# Patient Record
Sex: Male | Born: 1995 | Race: Black or African American | Hispanic: No | Marital: Single | Smoking: Never smoker
Health system: Southern US, Community
[De-identification: ages and names within clinical notes are randomized; demographics above are authoritative.]

## PROBLEM LIST (undated history)

## (undated) DIAGNOSIS — F909 Attention-deficit hyperactivity disorder, unspecified type: Secondary | ICD-10-CM

## (undated) HISTORY — PX: TUMOR REMOVAL: SHX12

---
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
## 2016-09-13 ENCOUNTER — Encounter (HOSPITAL_COMMUNITY): Payer: Self-pay | Admitting: Emergency Medicine

## 2016-09-13 ENCOUNTER — Emergency Department (HOSPITAL_COMMUNITY)
Admission: EM | Admit: 2016-09-13 | Discharge: 2016-09-13 | Disposition: A | Discharge status: Home / Self Care | Payer: Self-pay | Attending: Emergency Medicine | Admitting: Emergency Medicine

## 2016-09-13 DIAGNOSIS — F909 Attention-deficit hyperactivity disorder, unspecified type: Secondary | ICD-10-CM | POA: Insufficient documentation

## 2016-09-13 DIAGNOSIS — F1290 Cannabis use, unspecified, uncomplicated: Secondary | ICD-10-CM

## 2016-09-13 DIAGNOSIS — R45851 Suicidal ideations: Secondary | ICD-10-CM

## 2016-09-13 DIAGNOSIS — F329 Major depressive disorder, single episode, unspecified: Secondary | ICD-10-CM | POA: Insufficient documentation

## 2016-09-13 HISTORY — DX: Attention-deficit hyperactivity disorder, unspecified type: F90.9

## 2016-09-13 LAB — RAPID URINE DRUG SCREEN, HOSP PERFORMED
Amphetamines: NOT DETECTED
BARBITURATES: NOT DETECTED
Benzodiazepines: NOT DETECTED
Cocaine: NOT DETECTED
Opiates: NOT DETECTED
TETRAHYDROCANNABINOL: POSITIVE — AB

## 2016-09-13 LAB — COMPREHENSIVE METABOLIC PANEL
ALBUMIN: 3.9 g/dL (ref 3.5–5.0)
ALT: 9 U/L — AB (ref 17–63)
AST: 14 U/L — AB (ref 15–41)
Alkaline Phosphatase: 53 U/L (ref 38–126)
Anion gap: 7 (ref 5–15)
BUN: 8 mg/dL (ref 6–20)
CALCIUM: 9.4 mg/dL (ref 8.9–10.3)
CO2: 24 mmol/L (ref 22–32)
CREATININE: 0.87 mg/dL (ref 0.61–1.24)
Chloride: 107 mmol/L (ref 101–111)
GFR calc Af Amer: >60 mL/min (ref >60)
GLUCOSE: 86 mg/dL (ref 65–99)
Potassium: 4 mmol/L (ref 3.5–5.1)
Sodium: 138 mmol/L (ref 135–145)
TOTAL PROTEIN: 7.2 g/dL (ref 6.5–8.1)
Total Bilirubin: 1.2 mg/dL (ref 0.3–1.2)

## 2016-09-13 LAB — CBC
HEMATOCRIT: 42.3 % (ref 39.0–52.0)
HEMOGLOBIN: 13.8 g/dL (ref 13.0–17.0)
MCH: 27.7 pg (ref 26.0–34.0)
MCHC: 32.6 g/dL (ref 30.0–36.0)
MCV: 84.9 fL (ref 78.0–100.0)
Platelets: 256 10*3/uL (ref 150–400)
RBC: 4.98 MIL/uL (ref 4.22–5.81)
RDW: 12.9 % (ref 11.5–15.5)
WBC: 6.2 10*3/uL (ref 4.0–10.5)

## 2016-09-13 LAB — SALICYLATE LEVEL: Salicylate Lvl: <7 mg/dL (ref 2.8–30.0)

## 2016-09-13 LAB — ETHANOL: Alcohol, Ethyl (B): <5 mg/dL (ref <5)

## 2016-09-13 LAB — ACETAMINOPHEN LEVEL: Acetaminophen (Tylenol), Serum: <10 ug/mL — ABNORMAL LOW (ref 10–30)

## 2016-09-13 NOTE — ED Triage Notes (Signed)
Pt to ED via [facility] - brought in from a girl friend's house for suicidal ideation, pt is voluntary. He states he was venting to his friend and said he was stressed out and depressed and wanted to kill himself. He denies an actual plan, just thoughts of doing so. Patient denies HI. Pt is not very talkative in triage - tending to his cell phone, but answers questions appropriately.

## 2016-09-13 NOTE — ED Notes (Signed)
Pt belonging bags x2 @ triage desk.

## 2016-09-13 NOTE — ED Provider Notes (Signed)
[facility]-EMERGENCY DEPT Provider Note   CSN: [id number] Arrival date & time: 09/13/16  0012  By signing my name below, I, Rosario Adie, attest that this documentation has been prepared under the direction and in the presence of Lucine Bilski, Mayer Masker, MD. Electronically Signed: Rosario Adie, ED Scribe. 09/13/16. 3:45 AM.  History   Chief Complaint Chief Complaint  Patient presents with  . Suicidal   The history is provided by the patient. No language interpreter was used.    HPI Comments: Terrence Waters is a 21 y.o. male BIB [employer], with a h/o ADHD, who presents to the Emergency Department complaining of worsening suicidal ideation beginning prior to arrival. No reported plan. Per pt, he was talking to his girlfriend tonight and mentioned he wanted to commit suicide and she made him come into the ED. Per pt, he does have a h/o suicidal attempts previously attempting to OD. Pt was not hospitalized during this previous attempt. He does not currently have access to any drugs to be able to OD, and he does not have any access to firearms. No alcohol or illicit drug usage. He denies HI, auditory/visual hallucinations, fever, chest pain, shortness of breath, or any other associated symptoms.   Past Medical History:  Diagnosis Date  . ADHD    There are no active problems to display for this patient.  Past Surgical History:  Procedure Laterality Date  . 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  Social History Social History  Substance Use Topics  . Smoking status: Never Smoker  . Smokeless tobacco: Never Used  . Alcohol use No   Allergies   Patient has no known allergies.  Review of Systems Review of Systems  Constitutional: Negative for fever.  Respiratory: Negative for shortness of breath.   Cardiovascular: Negative for chest pain.   Psychiatric/Behavioral: Positive for suicidal ideas. Negative for hallucinations.  All other systems reviewed and are negative.  Physical Exam Updated Vital Signs BP 116/73 (BP Location: Left Arm)   Pulse 61   Temp 98.6 F (37 C) (Oral)   Resp 16   Ht 6\' 1"  (1.854 m)   Wt 79.4 kg (175 lb)   SpO2 100%   BMI 23.09 kg/m   Physical Exam  Constitutional: He is oriented to person, place, and time. He appears well-developed and well-nourished.  HENT:  Head: Normocephalic and atraumatic.  Cardiovascular: Normal rate, regular rhythm and normal heart sounds.   No murmur heard. Pulmonary/Chest: Effort normal and breath sounds normal. No respiratory distress. He has no wheezes.  Musculoskeletal: He exhibits no edema.  Neurological: He is alert and oriented to person, place, and time.  Skin: Skin is warm and dry.  Psychiatric: He has a normal mood and affect.  Flat affect  Nursing note and vitals reviewed.  ED Treatments / Results  DIAGNOSTIC STUDIES: Oxygen Saturation is 100% on RA, normal by my interpretation.   COORDINATION OF CARE: 3:45 AM-Discussed next steps with pt. Pt verbalized understanding and is agreeable with the plan.   Labs (all labs ordered are listed, but only abnormal results are displayed) Labs Reviewed  COMPREHENSIVE METABOLIC PANEL - Abnormal; Notable for the following:       Result Value   AST 14 (*)    ALT 9 (*)  All other components within normal limits  ACETAMINOPHEN LEVEL - Abnormal; Notable for the following:    Acetaminophen (Tylenol), Serum <10 (*)    All other components within normal limits  RAPID URINE DRUG SCREEN, HOSP PERFORMED - Abnormal; Notable for the following:    Tetrahydrocannabinol POSITIVE (*)    All other components within normal limits  ETHANOL  SALICYLATE LEVEL  CBC   EKG  EKG Interpretation None      Radiology No results found.  Procedures Procedures   Medications Ordered in ED Medications - No data to  display  Initial Impression / Assessment and Plan / ED Course  I have reviewed the triage vital signs and the nursing notes.  Pertinent labs & imaging results that were available during my care of the patient were reviewed by me and considered in my medical decision making (see chart for details).     Patient presents with suicidal ideation. Vague plan. Reports prior attempts. Nontoxic-appearing. Patient was medically screened and is medically clear for TTS evaluation.  Patient is currently cooperative and voluntary.  Final Clinical Impressions(s) / ED Diagnoses   Final diagnoses:  Suicidal ideation   New Prescriptions New Prescriptions   No medications on file   I personally performed the services described in this documentation, which was scribed in my presence. The recorded information has been reviewed and is accurate.     Shon BatonHorton, Fumiko Cham F, MD 09/13/16 [phone]

## 2016-09-13 NOTE — Consult Note (Signed)
Telepsych Consultation   Reason for Consult:  Consult for Terrence Waters, a 21 y.o male brought to the ED for suicide ideations following getting angry with his girl friend. Referring Physician:  EDP Patient Identification: Terrence Waters MRN:  [id number] Principal Diagnosis: <principal problem not specified> Diagnosis:  There are no active problems to display for this patient.   Total Time spent with patient: 30 minutes  Subjective:   Terrence Waters is a 21 y.o. male patient admitted with, "my girlfriend wanted me to come here because I was feeling angry and wanted to hurt myself.  HPI: Per the assessment document by Cheryle Horsfall, on 09/13/16, "Terrence Waters is an 21 y.o. male, African American, Single who presents to [facility] per ED report: BIB GPD, with a h/o ADHD, who presents to the Emergency Department complaining ofworsening suicidal ideation beginning prior to arrival. No reported plan. Per pt, he was talking to his girlfriend tonight and mentioned he wanted to commit suicide and she made him come into the ED. Per pt, he does have a h/o suicidal attempts previously attempting to OD. Pt was not hospitalized during this previous attempt. He does not currently have access to any drugs to be able to OD, and he does not have any access to firearms. No alcohol or illicit drug usage. He denies HI, auditory/visual hallucinations. Patient states primary concern is he got upset with g/f and made SI comments/threats. Patient states that he currently resides with aunt/uncle relatives. Patient states that he has no hx. Of psychotic symptoms. Patient states that he has had loss of sleep with 4 hours or less per night. Patient denies current SI/HI and AVH. Patient denies hx. Of S.A. Patient denies hx. Of inpatient or outpatient psych care.  Patient is dressed in scrubs and is alert and oriented x4. Patient speech was within normal limits and motor behavior appeared normal.  Patient thought process is coherent. Patient  does not appear to be responding to internal stimuli. Patient was cooperative throughout the assessment and states that he  is  not agreeable to inpatient psychiatric treatment".   On Exam: Pt was lying in bed wearing a hospital scrub. Pt is alert and oriented x4, mood was euthymic with congruent affect. Pt reiterated the reason for him coming to the hospital as detailed above. Pt said he has had time to think through about his reactions and realized that he overreacted. Pt stated that he was just upset with his girlfriend when he made the comment about wanting to kill himself. Patient currently denies any thoughts or feelings of self harm or harm to others. Pt reports he just got a new job with [employer] and he is also in school at [employer] and was a bit stressed about life in general. He said he wants to go home and begin an OP therapy to manage his stress. Patient at this time denying any SI/HI/VAH and voices no concerns.  Past Psychiatric History: See H&P  Risk to Self: Suicidal Ideation: No Suicidal Intent: No Is patient at risk for suicide?: Yes Suicidal Plan?: No Access to Means: No What has been your use of drugs/alcohol within the last 12 months?: none How many times?: 2 Other Self Harm Risks: none Triggers for Past Attempts: Unknown Intentional Self Injurious Behavior: None Risk to Others: Homicidal Ideation: No Thoughts of Harm to Others: No Current Homicidal Intent: No Current Homicidal Plan: No Access to Homicidal Means: No Identified Victim: none History of harm to others?:  No Assessment of Violence: None Noted Violent Behavior Description: n/a Does patient have access to weapons?: No Criminal Charges Pending?: No Does patient have a court date: No Prior Inpatient Therapy: Prior Inpatient Therapy: No Prior Therapy Dates: n/a Prior Therapy Facilty/Provider(s): n/a Reason for Treatment: n/a Prior Outpatient Therapy: Prior Outpatient  Therapy: No Prior Therapy Dates: n/a Prior Therapy Facilty/Provider(s): n/a Reason for Treatment: n/a Does patient have an ACCT team?: No Does patient have Intensive In-House Services?  : No Does patient have [facility] services? : No Does patient have P4CC services?: No  Past Medical History:  Past Medical History:  Diagnosis Date  . ADHD     Past Surgical History:  Procedure Laterality Date  . 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 Family Psychiatric  History: Unknown Social History:  History  Alcohol Use No     History  Drug Use  . Types: Marijuana    Social History   Social History  . Marital status: Single    Spouse name: N/A  . Number of children: N/A  . Years of education: N/A   Social History Main Topics  . Smoking status: Never Smoker  . Smokeless tobacco: Never Used  . Alcohol use No  . Drug use: Yes    Types: Marijuana  . Sexual activity: Not Asked   Other Topics Concern  . None   Social History Narrative  . None   Additional Social History:    Allergies:  No Known Allergies  Labs:  Results for orders placed or performed during the hospital encounter of 09/13/16 (from the past 48 hour(s))  Comprehensive metabolic panel     Status: Abnormal   Collection Time: 09/13/16 12:25 AM  Result Value Ref Range   Sodium 138 135 - 145 mmol/L   Potassium 4.0 3.5 - 5.1 mmol/L   Chloride 107 101 - 111 mmol/L   CO2 24 22 - 32 mmol/L   Glucose, Bld 86 65 - 99 mg/dL   BUN 8 6 - 20 mg/dL   Creatinine, Ser 0.87 0.61 - 1.24 mg/dL   Calcium 9.4 8.9 - 10.3 mg/dL   Total Protein 7.2 6.5 - 8.1 g/dL   Albumin 3.9 3.5 - 5.0 g/dL   AST 14 (L) 15 - 41 U/L   ALT 9 (L) 17 - 63 U/L   Alkaline Phosphatase 53 38 - 126 U/L   Total Bilirubin 1.2 0.3 - 1.2 mg/dL   GFR calc non Af Amer >60 >60 mL/min   GFR calc Af Amer >60 >60 mL/min    Comment: (NOTE) The eGFR has been calculated using the CKD EPI  equation. This calculation has not been validated in all clinical situations. eGFR's persistently <60 mL/min signify possible Chronic Kidney Disease.    Anion gap 7 5 - 15  Ethanol     Status: None   Collection Time: 09/13/16 12:25 AM  Result Value Ref Range   Alcohol, Ethyl (B) <5 <5 mg/dL    Comment:        LOWEST DETECTABLE LIMIT FOR SERUM ALCOHOL IS 5 mg/dL FOR MEDICAL PURPOSES ONLY   Salicylate level     Status: None   Collection Time: 09/13/16 12:25 AM  Result Value Ref Range   Salicylate Lvl <7.1 2.8 - 30.0 mg/dL  Acetaminophen level     Status: Abnormal   Collection Time: 09/13/16 12:25 AM  Result Value Ref Range  Acetaminophen (Tylenol), Serum <10 (L) 10 - 30 ug/mL    Comment:        THERAPEUTIC CONCENTRATIONS VARY SIGNIFICANTLY. A RANGE OF 10-30 ug/mL MAY BE AN EFFECTIVE CONCENTRATION FOR MANY PATIENTS. HOWEVER, SOME ARE BEST TREATED AT CONCENTRATIONS OUTSIDE THIS RANGE. ACETAMINOPHEN CONCENTRATIONS >150 ug/mL AT 4 HOURS AFTER INGESTION AND >50 ug/mL AT 12 HOURS AFTER INGESTION ARE OFTEN ASSOCIATED WITH TOXIC REACTIONS.   cbc     Status: None   Collection Time: 09/13/16 12:25 AM  Result Value Ref Range   WBC 6.2 4.0 - 10.5 K/uL   RBC 4.98 4.22 - 5.81 MIL/uL   Hemoglobin 13.8 13.0 - 17.0 g/dL   HCT 42.3 39.0 - 52.0 %   MCV 84.9 78.0 - 100.0 fL   MCH 27.7 26.0 - 34.0 pg   MCHC 32.6 30.0 - 36.0 g/dL   RDW 12.9 11.5 - 15.5 %   Platelets 256 150 - 400 K/uL  Rapid urine drug screen (hospital performed)     Status: Abnormal   Collection Time: 09/13/16 12:38 AM  Result Value Ref Range   Opiates NONE DETECTED NONE DETECTED   Cocaine NONE DETECTED NONE DETECTED   Benzodiazepines NONE DETECTED NONE DETECTED   Amphetamines NONE DETECTED NONE DETECTED   Tetrahydrocannabinol POSITIVE (A) NONE DETECTED   Barbiturates NONE DETECTED NONE DETECTED    Comment:        DRUG SCREEN FOR MEDICAL PURPOSES ONLY.  IF CONFIRMATION IS NEEDED FOR ANY PURPOSE, NOTIFY  LAB WITHIN 5 DAYS.        LOWEST DETECTABLE LIMITS FOR URINE DRUG SCREEN Drug Class       Cutoff (ng/mL) Amphetamine      1000 Barbiturate      200 Benzodiazepine   824 Tricyclics       235 Opiates          300 Cocaine          300 THC              50     No current facility-administered medications for this encounter.    No current outpatient prescriptions on file.    Musculoskeletal: UTA via camera  Psychiatric Specialty Exam: Physical Exam  Nursing note and vitals reviewed.   Review of Systems  Psychiatric/Behavioral: Negative for depression, hallucinations, memory loss, substance abuse and suicidal ideas. The patient is not nervous/anxious and does not have insomnia.   All other systems reviewed and are negative.   Blood pressure 124/62, pulse 82, temperature 98.3 F (36.8 C), temperature source Oral, resp. rate 16, height 6' 1"  (1.854 m), weight 79.4 kg (175 lb), SpO2 100 %.Body mass index is 23.09 kg/m.  General Appearance: wearing hospital gown  Eye Contact:  Good  Speech:  Clear and Coherent and Normal Rate  Volume:  Normal  Mood:  Euthymic  Affect:  Appropriate and Congruent  Thought Process:  Coherent  Orientation:  Full (Time, Place, and Person)  Thought Content:  WDL and Logical  Suicidal Thoughts:  No  Homicidal Thoughts:  No  Memory:  Immediate;   Good Recent;   Good Remote;   Good  Judgement:  Good  Insight:  Good and Present  Psychomotor Activity:  Normal  Concentration:  Concentration: Good and Attention Span: Good  Recall:  Good  Fund of Knowledge:  Good  Language:  Good  Akathisia:  No  Handed:  Right  AIMS (if indicated):     Assets:  Communication Skills Desire for Improvement  Financial Resources/Insurance  ADL's:  Intact  Cognition:  WNL  Sleep:        Treatment Plan Summary: Daily contact with patient to assess and evaluate symptoms and progress in treatment, Medication management and Plan is to discharge with OP resources to  follow up   Disposition: No evidence of imminent risk to self or others at present.   Patient does not meet criteria for psychiatric inpatient admission. Supportive therapy provided about ongoing stressors. Refer to IOP. Discussed crisis plan, support from social network, calling 911, coming to the Emergency Department, and calling Suicide Hotline.  Vicenta Aly, NP 09/13/2016 1:21 PM  Agree with NP assessment

## 2016-09-13 NOTE — Progress Notes (Signed)
Per Donell SievertSpencer, Simon NP recommend a.m. Psych evaluation Chandria Rookstool K. Sherlon HandingHarris, LCAS-A, LPC-A, [facility]  Counselor 09/13/2016 5:08 AM

## 2016-09-13 NOTE — ED Notes (Signed)
TTS counselor informed RN that pt can be discharged.

## 2016-09-13 NOTE — ED Notes (Signed)
TTS assessment underway.  

## 2016-09-13 NOTE — Progress Notes (Signed)
Per NP, Terrence Fermoina pt is psych cleared and recommended for d/c. Report given to Whitney, RN to notify the EDP the pt can be d/c.   Terrence Waters, MSW, LCSWA CSW Disposition [phone]

## 2016-09-13 NOTE — ED Provider Notes (Signed)
Received care of patient from Dr. Vallarie MareHart Horton at 8 AM. Please see her note for previous history, physical and prior care. Briefly, this is 21 year old male who presented with concern for suicidal ideation without a plan. He has been medically cleared and is currently awaiting TTS consult. He is here voluntarily.  TTS evaluated patient and feel he is stable for discharge and outpatient follow-up. Patient contracts for safety. Reports he will return if he develops worsening depression, SI. Patient discharged in stable condition with understanding of reasons to return.    [name], [name], MD 09/16/16 1036

## 2016-09-13 NOTE — BH Assessment (Signed)
Tele Assessment Note   Terrence Waters is an 21 y.o. male, African American, Single who presents to [facility] ED per ED report: BIB GPD, with a h/o ADHD, who presents to the Emergency Department complaining of worsening suicidal ideation beginning prior to arrival. No reported plan. Per pt, he was talking to his girlfriend tonight and mentioned he wanted to commit suicide and she made him come into the ED. Per pt, he does have a h/o suicidal attempts previously attempting to OD. Pt was not hospitalized during this previous attempt. He does not currently have access to any drugs to be able to OD, and he does not have any access to firearms. No alcohol or illicit drug usage. He denies HI, auditory/visual hallucinations. Patient states primary concern is he got upset with g/f and made SI comments/threats. Patient states that he currently resides with aunt/uncle relatives. Patient states that he has no hx. Of psychotic symptoms. Patient states that he has had loss of sleep with 4 hours or less per night. Patient denies current SI/HI and AVH. Patient denies hx. Of S.A. Patient denies hx. Of inpatient or outpatient psych care.  Patient is dressed in scrubs and is alert and oriented x4. Patient speech was within normal limits and motor behavior appeared normal. Patient thought process is coherent. Patient  does not appear to be responding to internal stimuli. Patient was cooperative throughout the assessment and states that he  is  not agreeable to inpatient psychiatric treatment.  Diagnosis: Major Depressive Disorder, Single Episode  Past Medical History:  Past Medical History:  Diagnosis Date  . ADHD     Past Surgical History:  Procedure Laterality Date  . TUMOR REMOVAL     tumor removal at left shin area at age 23    Family History: No family history on file.  Social History:  reports that he has never smoked. He has never used smokeless tobacco. He reports that he uses drugs, including  Marijuana. He reports that he does not drink alcohol.  Additional Social History:  Alcohol / Drug Use Pain Medications: SEE MAR Prescriptions: SEE MAR Over the Counter: SEE MAR History of alcohol / drug use?: No history of alcohol / drug abuse  CIWA: CIWA-Ar BP: 116/73 Pulse Rate: 61 COWS:    PATIENT STRENGTHS: (choose at least two) Ability for insight Capable of independent living Communication skills  Allergies: No Known Allergies  Home Medications:  (Not in a hospital admission)  OB/GYN Status:  No LMP for male patient.  General Assessment Data Location of Assessment: [facility] ED TTS Assessment: In system Is this a Tele or Face-to-Face Assessment?: Tele Assessment Is this an Initial Assessment or a Re-assessment for this encounter?: Initial Assessment Marital status: Single Maiden name: n/a Is patient pregnant?: No Pregnancy Status: No Living Arrangements: Other relatives Can pt return to current living arrangement?: Yes Admission Status: Voluntary Is patient capable of signing voluntary admission?: Yes Referral Source: Other Insurance type: [employer]     Crisis Care Plan Living Arrangements: Other relatives Name of Psychiatrist: none Name of Therapist: none  Education Status Is patient currently in school?: No Current Grade: n/a Highest grade of school patient has completed: 10th Name of school: n/a Contact person: none given  Risk to self with the past 6 months Suicidal Ideation: No Has patient been a risk to self within the past 6 months prior to admission? : No Suicidal Intent: No Has patient had any suicidal intent within the past 6 months  prior to admission? : No Is patient at risk for suicide?: Yes Suicidal Plan?: No Has patient had any suicidal plan within the past 6 months prior to admission? : No Access to Means: No What has been your use of drugs/alcohol within the last 12 months?: none Previous Attempts/Gestures: Yes How many times?:  2 Other Self Harm Risks: none Triggers for Past Attempts: Unknown Intentional Self Injurious Behavior: None Family Suicide History: No Recent stressful life event(s): Turmoil (Comment) Persecutory voices/beliefs?: No Depression: Yes Depression Symptoms: Insomnia, Fatigue, Guilt, Loss of interest in usual pleasures, Feeling worthless/self pity Substance abuse history and/or treatment for substance abuse?: No Suicide prevention information given to non-admitted patients: Yes  Risk to Others within the past 6 months Homicidal Ideation: No Does patient have any lifetime risk of violence toward others beyond the six months prior to admission? : No Thoughts of Harm to Others: No Current Homicidal Intent: No Current Homicidal Plan: No Access to Homicidal Means: No Identified Victim: none History of harm to others?: No Assessment of Violence: None Noted Violent Behavior Description: n/a Does patient have access to weapons?: No Criminal Charges Pending?: No Does patient have a court date: No Is patient on probation?: No  Psychosis Hallucinations: None noted Delusions: None noted  Mental Status Report Appearance/Hygiene: In scrubs Eye Contact: Good Motor Activity: Freedom of movement Speech: Logical/coherent Level of Consciousness: Alert Mood: Depressed Affect: Depressed Anxiety Level: Moderate Thought Processes: Relevant Judgement: Unimpaired Orientation: Person, Place, Time, Situation, Appropriate for developmental age Obsessive Compulsive Thoughts/Behaviors: Minimal  Cognitive Functioning Concentration: Normal Memory: Recent Intact, Remote Intact IQ: Average Insight: Poor Impulse Control: Poor Appetite: Fair Weight Loss: 0 Weight Gain: 0 Sleep: Decreased Total Hours of Sleep: 4 Vegetative Symptoms: None  ADLScreening [facility] Assessment Services) Patient's cognitive ability adequate to safely complete daily activities?: Yes Patient able to express need for assistance  with ADLs?: Yes Independently performs ADLs?: Yes (appropriate for developmental age)  Prior Inpatient Therapy Prior Inpatient Therapy: No Prior Therapy Dates: n/a Prior Therapy Facilty/Provider(s): n/a Reason for Treatment: n/a  Prior Outpatient Therapy Prior Outpatient Therapy: No Prior Therapy Dates: n/a Prior Therapy Facilty/Provider(s): n/a Reason for Treatment: n/a Does patient have an ACCT team?: No Does patient have Intensive In-House Services?  : No Does patient have [facility] services? : No Does patient have P4CC services?: No  ADL Screening (condition at time of admission) Patient's cognitive ability adequate to safely complete daily activities?: Yes Is the patient deaf or have difficulty hearing?: No Does the patient have difficulty seeing, even when wearing glasses/contacts?: No Does the patient have difficulty concentrating, remembering, or making decisions?: No Patient able to express need for assistance with ADLs?: Yes Does the patient have difficulty dressing or bathing?: No Independently performs ADLs?: Yes (appropriate for developmental age) Weakness of Legs: None Weakness of Arms/Hands: None       Abuse/Neglect Assessment (Assessment to be complete while patient is alone) Physical Abuse: Denies Verbal Abuse: Denies Sexual Abuse: Denies Exploitation of patient/patient's resources: Denies Self-Neglect: Denies Values / Beliefs Cultural Requests During Hospitalization: None Spiritual Requests During Hospitalization: None   Advance Directives (For Healthcare) Does Patient Have a Medical Advance Directive?: No Would patient like information on creating a medical advance directive?: No - Patient declined    Additional Information 1:1 In Past 12 Months?: No CIRT Risk: No Elopement Risk: No Does patient have medical clearance?: Yes     Disposition: Per Donell Sievert NP recommend a.m. Psych evaluation Disposition Initial Assessment Completed for this  Encounter: Yes Disposition of Patient: Other dispositions (TBD)  Elsie LincolnShean K Moua Rasmusson 09/13/2016 5:01 AM

## 2016-09-13 NOTE — ED Notes (Signed)
[facility] for AM psych re-eval.

## 2017-01-16 ENCOUNTER — Emergency Department (HOSPITAL_COMMUNITY)
Admission: EM | Admit: 2017-01-16 | Discharge: 2017-01-17 | Discharge status: Against Medical Advice | Payer: Self-pay | Attending: Emergency Medicine | Admitting: Emergency Medicine

## 2017-01-16 ENCOUNTER — Encounter (HOSPITAL_COMMUNITY): Payer: Self-pay | Admitting: Family Medicine

## 2017-01-16 DIAGNOSIS — Z5321 Procedure and treatment not carried out due to patient leaving prior to being seen by health care provider: Secondary | ICD-10-CM | POA: Insufficient documentation

## 2017-01-16 DIAGNOSIS — R1084 Generalized abdominal pain: Secondary | ICD-10-CM | POA: Insufficient documentation

## 2017-01-16 LAB — CBC
HCT: 41.4 % (ref 39.0–52.0)
HEMOGLOBIN: 13.5 g/dL (ref 13.0–17.0)
MCH: 27.7 pg (ref 26.0–34.0)
MCHC: 32.6 g/dL (ref 30.0–36.0)
MCV: 85 fL (ref 78.0–100.0)
PLATELETS: 318 10*3/uL (ref 150–400)
RBC: 4.87 MIL/uL (ref 4.22–5.81)
RDW: 13.8 % (ref 11.5–15.5)
WBC: 7.3 10*3/uL (ref 4.0–10.5)

## 2017-01-16 LAB — URINALYSIS, ROUTINE W REFLEX MICROSCOPIC
BILIRUBIN URINE: NEGATIVE
Glucose, UA: NEGATIVE mg/dL
HGB URINE DIPSTICK: NEGATIVE
Ketones, ur: NEGATIVE mg/dL
Leukocytes, UA: NEGATIVE
NITRITE: NEGATIVE
PROTEIN: NEGATIVE mg/dL
SPECIFIC GRAVITY, URINE: 1.024 (ref 1.005–1.030)
pH: 6 (ref 5.0–8.0)

## 2017-01-16 LAB — COMPREHENSIVE METABOLIC PANEL
ALBUMIN: 4.2 g/dL (ref 3.5–5.0)
ALK PHOS: 65 U/L (ref 38–126)
ALT: 11 U/L — AB (ref 17–63)
AST: 14 U/L — AB (ref 15–41)
Anion gap: 7 (ref 5–15)
BUN: 10 mg/dL (ref 6–20)
CALCIUM: 9.4 mg/dL (ref 8.9–10.3)
CO2: 29 mmol/L (ref 22–32)
CREATININE: 0.86 mg/dL (ref 0.61–1.24)
Chloride: 102 mmol/L (ref 101–111)
GFR calc Af Amer: >60 mL/min (ref >60)
GFR calc non Af Amer: >60 mL/min (ref >60)
GLUCOSE: 93 mg/dL (ref 65–99)
Potassium: 4.1 mmol/L (ref 3.5–5.1)
SODIUM: 138 mmol/L (ref 135–145)
Total Bilirubin: 0.3 mg/dL (ref 0.3–1.2)
Total Protein: 8.1 g/dL (ref 6.5–8.1)

## 2017-01-16 LAB — LIPASE, BLOOD: Lipase: 22 U/L (ref 11–51)

## 2017-01-16 NOTE — ED Triage Notes (Signed)
Patient is complaining of mid, upper abd pain with nausea and vomiting. Symptoms started around 14:00. Patient is not actively vomiting. Denies taking any medication for symptoms. Patient using cell phone while triaging.

## 2017-01-18 ENCOUNTER — Emergency Department (HOSPITAL_COMMUNITY)
Admission: EM | Admit: 2017-01-18 | Discharge: 2017-01-18 | Disposition: A | Discharge status: Home / Self Care | Payer: Self-pay | Attending: Emergency Medicine | Admitting: Emergency Medicine

## 2017-01-18 ENCOUNTER — Encounter (HOSPITAL_COMMUNITY): Payer: Self-pay | Admitting: Nurse Practitioner

## 2017-01-18 DIAGNOSIS — R197 Diarrhea, unspecified: Secondary | ICD-10-CM | POA: Insufficient documentation

## 2017-01-18 DIAGNOSIS — R112 Nausea with vomiting, unspecified: Secondary | ICD-10-CM | POA: Insufficient documentation

## 2017-01-18 DIAGNOSIS — F909 Attention-deficit hyperactivity disorder, unspecified type: Secondary | ICD-10-CM | POA: Insufficient documentation

## 2017-01-18 MED ORDER — ONDANSETRON HCL 4 MG PO TABS: 4.0000 mg | ORAL_TABLET | Freq: Three times a day (TID) | ORAL | 0 refills | Status: AC | PRN

## 2017-01-18 MED ORDER — SODIUM CHLORIDE 0.9 % IV BOLUS (SEPSIS)
1000.0000 mL | Freq: Once | INTRAVENOUS | Status: AC
  Administered 2017-01-18: 1000 mL via INTRAVENOUS

## 2017-01-18 MED ORDER — ONDANSETRON HCL 4 MG/2ML IJ SOLN
4.0000 mg | Freq: Once | INTRAMUSCULAR | Status: DC
  Filled 2017-01-18: qty 2

## 2017-01-18 MED ORDER — ONDANSETRON 8 MG PO TBDP
8.0000 mg | ORAL_TABLET | Freq: Once | ORAL | Status: AC
  Administered 2017-01-18: 8 mg via ORAL
  Filled 2017-01-18: qty 1

## 2017-01-18 NOTE — ED Provider Notes (Signed)
[facility]-EMERGENCY DEPT Provider Note   CSN: [id number] Arrival date & time: 01/18/17  1132     History   Chief Complaint Chief Complaint  Patient presents with  . Nausea  . Emesis    HPI Terrence Waters is a 21 y.o. male.  HPI   Patient is a 21 year old male with a history of ADHD presenting for 3 days of nausea, vomiting, and diarrhea. Patient presented to the ED 2 days ago but was unable to stay due to the wait.patient reports that 3 days ago he had multiple episodes of nonbilious nonbloody vomiting, as well as multiple episodes of watery diarrhea. No melena or hematochezia.This has subsequently improved, but patient was still having trouble keeping down anything by mouth today. Patient has been maintaining some hydration with ginger ale and broth. Patient reports some abdominal discomfort but no focal abdominal pain. Patient denies any fever or chills. Patient reports he has had some dizziness and lightheadedness, but no syncopal episodes. Patient has tried smoking marijuana, which relieved the nausea. Patient had a known sick contact who had a similar illness in the home he is living in just prior to his presentation.  Past Medical History:  Diagnosis Date  . ADHD     Patient Active Problem List   Diagnosis Date Noted  . Suicidal ideation     Past Surgical History:  Procedure Laterality Date  . 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  Social History Social History  Substance Use Topics  . Smoking status: Never Smoker  . Smokeless tobacco: Never Used  . Alcohol use No     Allergies   Patient has no known allergies.   Review of Systems Review of Systems  Constitutional: Negative for chills and fever.  HENT: Negative for congestion, rhinorrhea and sore throat.   Eyes: Negative for visual disturbance.    Respiratory: Negative for cough, chest tightness and shortness of breath.   Cardiovascular: Negative for chest pain, palpitations and leg swelling.  Gastrointestinal: Positive for diarrhea, nausea and vomiting. Negative for abdominal pain and blood in stool.  Genitourinary: Negative for dysuria and flank pain.  Musculoskeletal: Negative for back pain and myalgias.  Skin: Negative for rash.  Neurological: Positive for dizziness and light-headedness. Negative for syncope and headaches.     Physical Exam Updated Vital Signs BP 108/71 (BP Location: Right Arm)   Pulse 82   Temp 98.4 F (36.9 C) (Oral)   Resp 18   Ht  (1.854 m)   Wt 79.4 kg (175 lb)   SpO2 100%   BMI 23.09 kg/m   Physical Exam  Constitutional: He appears well-developed and well-nourished. No distress.  HENT:  Head: Normocephalic and atraumatic.  Mouth/Throat: Oropharynx is clear and moist.  Eyes: Pupils are equal, round, and reactive to light. Conjunctivae and EOM are normal.  Neck: Normal range of motion. Neck supple.  Cardiovascular: Normal rate, regular rhythm, S1 normal, S2 normal and intact distal pulses.   No murmur heard. Pulmonary/Chest: Effort normal and breath sounds normal. He has no wheezes. He has no rales.  Abdominal: Soft. He exhibits no distension. There is no tenderness. There is no guarding.  Musculoskeletal: Normal range of motion. He exhibits no edema or deformity.  Lymphadenopathy:    He has no  cervical adenopathy.  Neurological: He is alert.  Cranial nerves grossly intact. Patient moves extremities with good coordination and without difficulty.  Skin: Skin is warm and dry. No rash noted. No erythema.  Psychiatric: He has a normal mood and affect. His behavior is normal. Judgment and thought content normal.  Nursing note and vitals reviewed.    ED Treatments / Results  Labs (all labs ordered are listed, but only abnormal results are displayed) Labs Reviewed - No data to  display  EKG  EKG Interpretation None       Radiology No results found.  Procedures Procedures (including critical care time)  Medications Ordered in ED Medications  sodium chloride 0.9 % bolus 1,000 mL (1,000 mLs Intravenous New Bag/Given 01/18/17 2029)  ondansetron (ZOFRAN-ODT) disintegrating tablet 8 mg (8 mg Oral Given 01/18/17 2029)     Initial Impression / Assessment and Plan / ED Course  I have reviewed the triage vital signs and the nursing notes.  Pertinent labs & imaging results that were available during my care of the patient were reviewed by me and considered in my medical decision making (see chart for details).  Clinical Course as of Jan 19 2144  Wed Jan 18, 2017  1957 Went to evaluate patient and he was not present in bed. Will recheck shortly.  [AM]  2007 Patient seen and evaluated. Patient afebrile, well-appearing and in no acute distress. Benign abdominal exam. Patient amenable to IV fluid hydration and discharged with Zofran provided by mouth challenge is tolerated.  [AM]  2130 Patient feeling well and tolerating by mouth. Patient ambulated in the hall with me. Patient given return precautions for any worsening nausea or vomiting and inability to take anything by mouth, or worsening abdominal pain.  [AM]    Clinical Course User Index [AM] Elisha Ponder, PA-C    Final Clinical Impressions(s) / ED Diagnoses   Final diagnoses:  None   MDM  Patient is a 21 year old male with a history of ADHD presenting for 3 days of nausea, vomiting, and diarrhea. Patient is well-appearing, afebrile, and tolerating by mouth liquids in the department today. Suspect that patient's symptoms are due to viral gastroenteritis in the setting of a sick contact. In addition, patient is improving over the 3 days. Patient is showing no signs of acute abdominal pathology such as cholecystitis, pancreatitis, acute bowel obstruction, or appendicitis. Patient responded well to fluid  hydration, and feels prepared for discharge. Patient is in understanding and agreement with the plan of care.  This is a supervised visit with Dr. Carmell Austria. Evaluation, management, and discharge planning discussed with this attending physician.  New Prescriptions New Prescriptions   No medications on file     Delia Chimes 01/18/17 2156    Benjiman Core, MD 01/19/17 [phone]

## 2017-01-18 NOTE — Discharge Instructions (Signed)
Please see the information and instructions below regarding your visit.  Your diagnoses today include:  1. Non-intractable vomiting with nausea, unspecified vomiting type   2. Diarrhea, unspecified type    Your symptoms are likely caused by a viral gastroenteritis. Your exam was reassuring today.your lab work from 2 days ago was normal.  Tests performed today include: See side panel of your discharge paperwork for testing performed today. Vital signs are listed at the bottom of these instructions.   Medications prescribed:    Take any prescribed medications only as prescribed, and any over the counter medications only as directed on the packaging.  Home care instructions:  Please follow any educational materials contained in this packet.   Follow-up instructions: Please follow-up with your primary care provider for further evaluation of your symptoms if they are not completely improved.   Return instructions:  Please return to the Emergency Department if you experience worsening symptoms.  Please return for any worsening abdominal pain, vomiting, or diarrhea. Please return if you're unable to keep down anything by mouth. Please return if you have any other emergent concerns.  Additional Information:   Your vital signs today were: BP 108/71 (BP Location: Right Arm)    Pulse 82    Temp 98.4 F (36.9 C) (Oral)    Resp 18    Ht  (1.854 m)    Wt 79.4 kg (175 lb)    SpO2 100%    BMI 23.09 kg/m  If your blood pressure (BP) was elevated on multiple readings during this visit above 130 for the top number or above 80 for the bottom number, please have this repeated by your primary care provider within one month. --------------  Thank you for allowing [place] to participate in your care today.

## 2017-01-18 NOTE — ED Notes (Signed)
Pt able to tolerate PO fluids.  

## 2017-01-18 NOTE — ED Notes (Signed)
No response when called from lobby for VS recollect.

## 2017-01-18 NOTE — ED Triage Notes (Signed)
Patient states for the past 3-4 days he has been having abdominal pain, nausea, and vomiting. Reports coming to ER 2 days ago but left due to the wait. He did report having lab work done while waiting. States he has just been sleeping when he is not vomiting at home. Feels tired and weak and was hoping it would go away but it is still happening. Denies taking any medications for symptoms. Reports he is only able to drink water and gingerale and it stays down sometimes. Unable to tolerated food without getting sick. Patient is ambulatory and can talk without complications in complete sentences. Denies fever and chills.

## 2017-07-27 ENCOUNTER — Emergency Department (HOSPITAL_COMMUNITY)
Admission: EM | Admit: 2017-07-27 | Discharge: 2017-07-27 | Disposition: A | Discharge status: Home / Self Care | Payer: Self-pay | Attending: Emergency Medicine | Admitting: Emergency Medicine

## 2017-07-27 ENCOUNTER — Other Ambulatory Visit: Payer: Self-pay

## 2017-07-27 ENCOUNTER — Encounter (HOSPITAL_COMMUNITY): Payer: Self-pay | Admitting: Emergency Medicine

## 2017-07-27 ENCOUNTER — Ambulatory Visit (HOSPITAL_COMMUNITY)
Admission: EM | Admit: 2017-07-27 | Discharge: 2017-07-27 | Disposition: A | Discharge status: Home / Self Care | Payer: Self-pay

## 2017-07-27 DIAGNOSIS — R45851 Suicidal ideations: Secondary | ICD-10-CM

## 2017-07-27 DIAGNOSIS — F332 Major depressive disorder, recurrent severe without psychotic features: Secondary | ICD-10-CM | POA: Insufficient documentation

## 2017-07-27 LAB — COMPREHENSIVE METABOLIC PANEL
ALK PHOS: 52 U/L (ref 38–126)
ALT: 11 U/L — AB (ref 17–63)
AST: 16 U/L (ref 15–41)
Albumin: 4.2 g/dL (ref 3.5–5.0)
Anion gap: 9 (ref 5–15)
BUN: 7 mg/dL (ref 6–20)
CHLORIDE: 106 mmol/L (ref 101–111)
CO2: 24 mmol/L (ref 22–32)
CREATININE: 0.97 mg/dL (ref 0.61–1.24)
Calcium: 9.5 mg/dL (ref 8.9–10.3)
GFR calc Af Amer: >60 mL/min (ref >60)
Glucose, Bld: 91 mg/dL (ref 65–99)
Potassium: 4.1 mmol/L (ref 3.5–5.1)
Sodium: 139 mmol/L (ref 135–145)
Total Bilirubin: 1.1 mg/dL (ref 0.3–1.2)
Total Protein: 7.3 g/dL (ref 6.5–8.1)

## 2017-07-27 LAB — CBC
HCT: 44.5 % (ref 39.0–52.0)
HEMOGLOBIN: 14.7 g/dL (ref 13.0–17.0)
MCH: 28.5 pg (ref 26.0–34.0)
MCHC: 33 g/dL (ref 30.0–36.0)
MCV: 86.4 fL (ref 78.0–100.0)
PLATELETS: 249 10*3/uL (ref 150–400)
RBC: 5.15 MIL/uL (ref 4.22–5.81)
RDW: 13.1 % (ref 11.5–15.5)
WBC: 6.1 10*3/uL (ref 4.0–10.5)

## 2017-07-27 LAB — ETHANOL

## 2017-07-27 LAB — RAPID URINE DRUG SCREEN, HOSP PERFORMED
AMPHETAMINES: NOT DETECTED
Barbiturates: NOT DETECTED
Benzodiazepines: NOT DETECTED
Cocaine: NOT DETECTED
Opiates: NOT DETECTED
Tetrahydrocannabinol: POSITIVE — AB

## 2017-07-27 LAB — ACETAMINOPHEN LEVEL: Acetaminophen (Tylenol), Serum: <10 ug/mL — ABNORMAL LOW (ref 10–30)

## 2017-07-27 LAB — SALICYLATE LEVEL: Salicylate Lvl: <7 mg/dL (ref 2.8–30.0)

## 2017-07-27 NOTE — [facility] Counselor (Signed)
Per Delorise Jacksonori, RN pt is currently in the waiting room. Tori, RN reported, she will have the pt's nurse to contact TTS once pt is placed in a room.   Redmond Pullingreylese D Woodrow Drab, MS, [facility], [facility] Triage Specialist [phone]

## 2017-07-27 NOTE — ED Notes (Signed)
Staffing office notified of need for suicide sitter for patient.

## 2017-07-27 NOTE — BH Assessment (Signed)
Attempted to complete TTS consult, called several numbers, staff unable to locate pt. Will try again later

## 2017-07-27 NOTE — ED Notes (Signed)
[facility] and ready for [facility].

## 2017-07-27 NOTE — ED Triage Notes (Signed)
Patient to ED c/o suicidal ideation for the past month, reports past attempt of cutting his wrists and hx anxiety and depression, not taking medication for either. He talked with his girlfriend about his feelings and she told him to come here. Accompanied by mother at this time, patient calm and cooperative. Denies plan at this time.

## 2017-07-27 NOTE — Discharge Instructions (Signed)
Please read attached information. If you experience any new or worsening signs or symptoms please return to the emergency room for evaluation. Please follow-up with your primary care provider or specialist as discussed.  °

## 2017-07-27 NOTE — ED Notes (Signed)
Pt states that he is not SI at this time and wants to go home.

## 2017-07-27 NOTE — ED Notes (Signed)
Mother at bedside until decision made to keep pt or let him go home.

## 2017-07-27 NOTE — BH Assessment (Addendum)
Tele Assessment Note   Patient Name: Terrence Waters MRN: [id number] Referring Physician: Leotis ShamesJeffery, [place]. Location of [facility]: [facility] Location of Provider: Behavioral [facility] TTS Department  Terrence Waters is an 22 y.o. male, who [facility] Clinician asked the pt if he wanted his mother present during the assessment. Clinician expressed to the pt, sensitive information will be discussed. Pt gave consent for his mother to engage in the assessment. Clinician asked the pt, "what brought you to the hospital?" Pt reported, "my friend told me to come to get checkout, I've been depressed for a while." Pt denies, suicidal. Clinician expressed to the pt per the EDP, he is presenting with suicidal thoughts. Pt continued to deny current SI. Pt reported, a year ago he attempted suicide by cutting his wrist. Pt denies, SI, HI, AVH, self-injurious behaviors and access to weapons.   Pt denies abuse. Pt reported, smoking a half a blunt, last night. Pt's UDS is positive for marijuana. Pt denies, being linked to OPT resources (medication management and/or counseling.)  Pt reported, previous inpatient admission.  Pt presents quiet/awake in scrubs with logical/cohernet speech. Pt's mood was depressed. Pt's affect was congruent with mood. Pt's judgement is unimpaired. Pt's concentration is normal. Pt's insight and impulse control are fair. Pt reported, [facility] he could contract for safety. Pt's mother reported, she felt safe if the pt was discharged. Pt reported, if inpatient treatment was recommended he will sign-in voluntarily.   Diagnosis: F33.2 Major Depressive Disorder, recurrent, severe without psychotic features.   Past Medical History:  Past Medical History:  Diagnosis Date  . ADHD     Past Surgical History:  Procedure Laterality Date  . 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  Social History:  reports that he has never smoked. He has never used smokeless tobacco. He reports that he has current or past drug history. Drug: Marijuana. He reports that he does not drink alcohol.  Additional Social History:  Alcohol / Drug Use Pain Medications: See MAR Prescriptions: See MAR Over the Counter: See MAR History of alcohol / drug use?: Yes Substance #1 Name of Substance 1: Marijuana. 1 - Age of First Use: UTA 1 - Amount (size/oz): Pt reported, smoknig half a blunt, last night.  1 - Frequency: UTA 1 - Duration: UTA 1 - Last Use / Amount: Pt reported, last night.   CIWA: CIWA-Ar BP: 120/72 Pulse Rate: 78 COWS:    Allergies:  Allergies  Allergen Reactions  . Banana Nausea And Vomiting    Home Medications:  (Not in a hospital admission)  OB/GYN Status:  No LMP for male patient.  General Assessment Data Assessment unable to be completed: Yes Reason for not completing assessment: Unable to locate pt [facility] Assessment: In system Is this a Tele or Face-to-Face Assessment?: Tele Assessment Is this an Initial Assessment or a Re-assessment for this encounter?: Initial Assessment Marital status: Single Living Arrangements: Other (Comment)(Roommate.) Can pt return to current living arrangement?: Yes Admission Status: Voluntary Is patient capable of signing voluntary admission?: Yes Referral Source: Self/Family/Friend Insurance type: Self-pay.      Crisis Care Plan Living Arrangements: Other (Comment)(Roommate.) Legal Guardian: Other:(Self. ) Name of Psychiatrist: NA Name of Therapist: NA  Education Status Is patient currently in school?: No Is the patient employed, unemployed or receiving disability?:  Employed  Risk to self with the past 6 months Suicidal Ideation: No(Pt denies. ) Has patient been a risk to self within the past 6 months prior to admission? : No(Pt denies.) Suicidal Intent: No Has patient had any  suicidal intent within the past 6 months prior to admission? : No Is patient at risk for suicide?: No Suicidal Plan?: No(Pt denies.) Has patient had any suicidal plan within the past 6 months prior to admission? : No Access to Means: No What has been your use of drugs/alcohol within the last 12 months?: Marijuana.  Previous Attempts/Gestures: Yes How many times?: 1 Other Self Harm Risks: Cutting.  Triggers for Past Attempts: Unknown Intentional Self Injurious Behavior: Cutting Comment - Self Injurious Behavior: Pt reported, cutting his wrist a year ago. as a suicide attempt.  Family Suicide History: No Recent stressful life event(s): Other (Comment)(stressful situation and work. ) Persecutory voices/beliefs?: No Depression: Yes Depression Symptoms: Feeling angry/irritable, Feeling worthless/self pity, Loss of interest in usual pleasures, Guilt, Fatigue Substance abuse history and/or treatment for substance abuse?: No Suicide prevention information given to non-admitted patients: Not applicable  Risk to Others within the past 6 months Homicidal Ideation: No(Pt denies. ) Does patient have any lifetime risk of violence toward others beyond the six months prior to admission? : No(Pt denies.) Thoughts of Harm to Others: No Current Homicidal Intent: No Current Homicidal Plan: No Access to Homicidal Means: Yes Describe Access to Homicidal Means: NA Identified Victim: NA History of harm to others?: No(Pt denies.) Assessment of Violence: None Noted Violent Behavior Description: NA Does patient have access to weapons?: No(Pt denies.) Criminal Charges Pending?: No Does patient have a court date: No Is patient on probation?: No  Psychosis Hallucinations: None noted Delusions: None noted  Mental Status Report Appearance/Hygiene: In scrubs Eye Contact: Fair Motor Activity: Unremarkable Speech: Logical/coherent Level of Consciousness: Quiet/awake Mood: Depressed Affect: Other  (Comment)(congruent with mood. ) Anxiety Level: Minimal Thought Processes: Coherent, Relevant Judgement: Unimpaired Orientation: Person, Place, Time, Situation Obsessive Compulsive Thoughts/Behaviors: None  Cognitive Functioning Concentration: Normal Memory: Recent Intact Is patient IDD: No Is patient DD?: No Insight: Fair Impulse Control: Fair Appetite: Good Sleep: Decreased Total Hours of Sleep: (Pt reported, 4-6 hours. ) Vegetative Symptoms: None  ADLScreening [facility] Assessment Services) Patient's cognitive ability adequate to safely complete daily activities?: Yes Patient able to express need for assistance with ADLs?: Yes Independently performs ADLs?: Yes (appropriate for developmental age)  Prior Inpatient Therapy Prior Inpatient Therapy: Yes Prior Therapy Dates: 2018 Prior Therapy Facilty/Provider(s): [facility] Reason for Treatment: Pt cut his wrist as a suicide attempt.   Prior Outpatient Therapy Prior Outpatient Therapy: No Does patient have an ACCT team?: No Does patient have Intensive In-House Services?  : No Does patient have [facility] services? : No Does patient have P4CC services?: No  ADL Screening (condition at time of admission) Patient's cognitive ability adequate to safely complete daily activities?: Yes Is the patient deaf or have difficulty hearing?: No Does the patient have difficulty seeing, even when wearing glasses/contacts?: Yes(Pt reported, he is supposed to wear glasses. ) Does the patient have difficulty concentrating, remembering, or making decisions?: Yes Patient able to express need for assistance with ADLs?: Yes Does the patient have difficulty dressing or bathing?: No Independently performs ADLs?: Yes (appropriate for developmental age) Does the patient have difficulty walking or climbing stairs?: No Weakness of Legs: None Weakness of Arms/Hands: None  Home Assistive Devices/Equipment Home Assistive Devices/Equipment: None     Abuse/Neglect Assessment (  Assessment to be complete while patient is alone) Abuse/Neglect Assessment Can Be Completed: Yes Physical Abuse: Denies(Pt denies. ) Verbal Abuse: Denies(Pt denies. ) Sexual Abuse: Denies(Pt denies.) Exploitation of patient/patient's resources: Denies(Pt denies. ) Self-Neglect: Denies(Pt denies. )     Advance Directives (For Healthcare) Does Patient Have a Medical Advance Directive?: No    Additional Information 1:1 In Past 12 Months?: No CIRT Risk: No Elopement Risk: No Does patient have medical clearance?: Yes     Disposition: Nira Conn, NP recommends discharge. Discussed disposition with Dr. Fredderick Phenix and Drue Flirt, RN.   Disposition Initial Assessment Completed for this Encounter: Yes Disposition of Patient: Discharge Patient refused recommended treatment: No Mode of transportation if patient is discharged?: Car  This service was provided via telemedicine using a 2-way, interactive audio and video technology.  Names of all persons participating in this telemedicine service and their role in this encounter. Name: Shelley Pooley Role: Mother             Redmond Pulling 07/27/2017 11:49 PM   Redmond Pulling, MS, [facility], [facility] Triage Specialist [phone]

## 2017-07-27 NOTE — ED Notes (Signed)
[facility] called. Recommendation for pt to be discharged with mother. Off going MD agrees, On coming md wants to speak with pt and mother before final decision.

## 2017-07-27 NOTE — ED Notes (Signed)
TTS in progress 

## 2017-07-27 NOTE — ED Notes (Signed)
Pt states he has been having trouble with passive suicidal thoughts for a while, states "I dont have a plan right now but i've been struggling with suicidal thoughts a lot". Pt here with mother, spoke with patient about not having resources for him here, pt agreeable to go to the ER, mother states she would drive him to the ER for treatment. Directions given, pt and mother ambulated out of UC without issue.

## 2017-07-27 NOTE — ED Notes (Signed)
Pt wanded by security. 

## 2017-07-27 NOTE — ED Provider Notes (Signed)
[facility] Provider Note   CSN: [id number] Arrival date & time: 07/27/17  1336     History   Chief Complaint Chief Complaint  Patient presents with  . Suicidal    HPI Terrence Waters is a 22 y.o. male.  HPI   22 year old male presents today with complaints of suicidal ideations.  Patient notes that recently he has had thoughts of killing himself.  He does not have any particular plan, but notes that he is attempted cutting his wrists in the past.  Patient reports he smokes marijuana and occasionally uses alcohol denies any other drug use.  He denies any physical complaints here today.  Patient denies any homicidal ideations, no visual or auditory hallucinations.  Past Medical History:  Diagnosis Date  . ADHD     Patient Active Problem List   Diagnosis Date Noted  . Suicidal ideation     Past Surgical History:  Procedure Laterality Date  . 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  Social History Social History   Tobacco Use  . Smoking status: Never Smoker  . Smokeless tobacco: Never Used  Substance Use Topics  . Alcohol use: No  . Drug use: Yes    Types: Marijuana    Comment: Last used: Two days ago     Allergies   Banana   Review of Systems Review of Systems  All other systems reviewed and are negative.    Physical Exam Updated Vital Signs BP 101/63 (BP Location: Right Arm)   Pulse 84   Temp 98.4 F (36.9 C) (Oral)   Resp 16   SpO2 97%   Physical Exam  Constitutional: He is oriented to person, place, and time. He appears well-developed and well-nourished.  HENT:  Head: Normocephalic and atraumatic.  Eyes: Pupils are equal, round, and reactive to light. Conjunctivae are normal. Right eye exhibits no discharge. Left eye exhibits no discharge. No scleral  icterus.  Neck: Normal range of motion. No JVD present. No tracheal deviation present.  Cardiovascular: Normal rate, regular rhythm, normal heart sounds and intact distal pulses.  No murmur heard. Pulmonary/Chest: Effort normal and breath sounds normal. No stridor. No respiratory distress. He has no wheezes. He has no rales. He exhibits no tenderness.  Neurological: He is alert and oriented to person, place, and time. Coordination normal.  Psychiatric: He has a normal mood and affect. His behavior is normal. Judgment and thought content normal.  Nursing note and vitals reviewed.    ED Treatments / Results  Labs (all labs ordered are listed, but only abnormal results are displayed) Labs Reviewed  COMPREHENSIVE METABOLIC PANEL - Abnormal; Notable for the following components:      Result Value   ALT 11 (*)    All other components within normal limits  ACETAMINOPHEN LEVEL - Abnormal; Notable for the following components:   Acetaminophen (Tylenol), Serum <10 (*)    All other components within normal limits  RAPID URINE DRUG SCREEN, HOSP PERFORMED - Abnormal; Notable for the following components:   Tetrahydrocannabinol POSITIVE (*)    All other components within normal limits  ETHANOL  SALICYLATE LEVEL  CBC    EKG None  Radiology No results found.  Procedures Procedures (including critical care time)  Medications  Ordered in ED Medications - No data to display   Initial Impression / Assessment and Plan / ED Course  I have reviewed the triage vital signs and the nursing notes.  Pertinent labs & imaging results that were available during my care of the patient were reviewed by me and considered in my medical decision making (see chart for details).      Final Clinical Impressions(s) / ED Diagnoses   Final diagnoses:  Suicidal ideation    Labs: Rapid urine drug screen, CMP, ethanol, salicylate, acetaminophen, CBC  Imaging:  Consults:  Therapeutics:  Discharge  Meds:   Assessment/Plan: Patient here with suicidal ideations, patient is medically cleared awaiting TTS evaluation.  TTS will be evaluating the patient shortly.  I feel the patient is stable and would be comfortable with any decision behavioral health would like including disposition with outpatient resources or ongoing inpatient management.  I discussed with mother both options she agrees that both would be reasonable.  I have plan for patient's paperwork in the event behavioral health indicates patient can go home mother knows all follow-up information and return precautions.  Paperwork left quad asked staff with instructions at discharge based on behavioral health decision.       ED Discharge Orders    None       Rosalio Loud 07/27/17 2205    Pricilla Loveless, MD 07/27/17 [phone]

## 2017-07-27 NOTE — ED Notes (Signed)
Patient denies pain and is resting comfortably.  

## 2018-07-09 ENCOUNTER — Emergency Department (HOSPITAL_COMMUNITY)
Admission: EM | Admit: 2018-07-09 | Discharge: 2018-07-09 | Disposition: A | Discharge status: Home / Self Care | Payer: Self-pay | Attending: Emergency Medicine | Admitting: Emergency Medicine

## 2018-07-09 ENCOUNTER — Other Ambulatory Visit: Payer: Self-pay

## 2018-07-09 DIAGNOSIS — F909 Attention-deficit hyperactivity disorder, unspecified type: Secondary | ICD-10-CM | POA: Insufficient documentation

## 2018-07-09 DIAGNOSIS — J029 Acute pharyngitis, unspecified: Secondary | ICD-10-CM | POA: Insufficient documentation

## 2018-07-09 LAB — GROUP A STREP BY PCR: GROUP A STREP BY PCR: NOT DETECTED

## 2018-07-09 NOTE — Discharge Instructions (Signed)
You were seen in the ER for sore throat and nasal congestion, runny nose.  Your strep test was negative.  I suspect her symptoms are from a virus.  Alternate ibuprofen and acetaminophen for throat pain and swelling.  Warm liquids such as tea with honey will help with the inflammation.  Stay well hydrated.  Return to the ER if there is any worsening pain in the throat, difficulty swallowing, changes to her voice, difficulty opening her jaw.

## 2018-07-09 NOTE — ED Provider Notes (Addendum)
[facility] EMERGENCY DEPARTMENT Provider Note   CSN: [id number] Arrival date & time: 07/09/18  2001    History   Chief Complaint Chief Complaint  Patient presents with  . Sore Throat   HPI Terrence Waters is a 23 y.o. male is here for evaluation of sore throat.  Associated with runny nose.  Onset this morning while at work.  His boss told him he needed to come to the ER.  No interventions.  No alleviating factors.  Pain in his throat is mild.  Denies any sick contacts.  Denies any associated fevers, chills, cough, headaches, changes in voice, trismus, chest pain or shortness of breath, myalgias, vomiting, diarrhea, abdominal pain.   HPI  Past Medical History:  Diagnosis Date  . ADHD     Patient Active Problem List   Diagnosis Date Noted  . Suicidal ideation     Past Surgical History:  Procedure Laterality Date  . 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  Social History Social History   Tobacco Use  . Smoking status: Never Smoker  . Smokeless tobacco: Never Used  Substance Use Topics  . Alcohol use: No  . Drug use: Yes    Types: Marijuana    Comment: Last used: Two days ago     Allergies   Banana   Review of Systems Review of Systems  HENT: Positive for rhinorrhea and sore throat.   All other systems reviewed and are negative.    Physical Exam Updated Vital Signs BP 123/75 (BP Location: Right Arm)   Pulse 73   Resp 16   SpO2 99%   Physical Exam Vitals signs and nursing note reviewed.  Constitutional:      General: He is not in acute distress.    Appearance: He is well-developed.     Comments: NAD.  HENT:     Head: Normocephalic and atraumatic.     Right Ear: External ear normal.     Left Ear: External ear normal.     Nose: Congestion present.     Comments: Moderate  mucosal edema, erythema.  No rhinorrhea.    Mouth/Throat:     Mouth: Mucous membranes are moist.     Comments: Mild erythema diffusely to oropharynx, tonsils.  Mildly, symmetrically enlarged tonsils bilaterally.  Uvula is midline.  No exudates.  No petechiae.  No trismus.  No drooling.  Normal phonation.  No sublingual fullness. Eyes:     General: No scleral icterus.    Conjunctiva/sclera: Conjunctivae normal.  Neck:     Musculoskeletal: Normal range of motion and neck supple.     Comments: No anterior neck asymmetric edema.  No cervical lymphadenopathy. Cardiovascular:     Rate and Rhythm: Normal rate and regular rhythm.     Heart sounds: Normal heart sounds. No murmur.  Pulmonary:     Effort: Pulmonary effort is normal.     Breath sounds: Normal breath sounds. No wheezing.  Musculoskeletal: Normal range of motion.        General: No deformity.  Skin:    General: Skin is warm and dry.     Capillary Refill: Capillary refill takes less than 2 seconds.  Neurological:     Mental Status: He is alert and oriented to person,  place, and time.  Psychiatric:        Behavior: Behavior normal.        Thought Content: Thought content normal.        Judgment: Judgment normal.      ED Treatments / Results  Labs (all labs ordered are listed, but only abnormal results are displayed) Labs Reviewed  GROUP A STREP BY PCR    EKG None  Radiology No results found.  Procedures Procedures (including critical care time)  Medications Ordered in ED Medications - No data to display   Initial Impression / Assessment and Plan / ED Course  I have reviewed the triage vital signs and the nursing notes.  Pertinent labs & imaging results that were available during my care of the patient were reviewed by me and considered in my medical decision making (see chart for details).       23 year old here with pharyngitis.  Rapid strep is negative.  Likely viral related versus postnasal drip  inflammation of the throat.  No concerning physical exam findings such as asymmetric tonsillar hypertrophy, uvula deviation, muffled voice, trismus, drooling.  History and exam is not concerning for deep neck space infection like RPA, PTA, Ludwig's.  Doubt epiglottitis.  Centor score is low.  No indication for antibiotics.  Will discharge with symptomatic management.. Will discharge with symptomatic management. I have discussed signs and symptoms that would warrant immediate return to ER and patient verbalized understanding. She is tolerating secretions and PO in ER.    Final Clinical Impressions(s) / ED Diagnoses   Final diagnoses:  Viral pharyngitis    ED Discharge Orders    None       Jerrell Mylar 07/09/18 2243    Linwood Dibbles, MD 07/10/18 1505

## 2018-07-09 NOTE — ED Triage Notes (Signed)
Pt reports sore throat, runny nose X1 day. States his job sent him home.

## 2019-01-25 ENCOUNTER — Other Ambulatory Visit: Payer: Self-pay

## 2019-01-25 DIAGNOSIS — Z20822 Contact with and (suspected) exposure to covid-19: Secondary | ICD-10-CM

## 2019-01-26 LAB — NOVEL CORONAVIRUS, NAA: SARS-CoV-2, NAA: NOT DETECTED

## 2019-11-14 ENCOUNTER — Ambulatory Visit (HOSPITAL_COMMUNITY)
Admission: EM | Admit: 2019-11-14 | Discharge: 2019-11-14 | Disposition: A | Discharge status: Home / Self Care | Payer: Self-pay | Attending: Internal Medicine | Admitting: Internal Medicine

## 2019-11-14 ENCOUNTER — Encounter (HOSPITAL_COMMUNITY): Payer: Self-pay | Admitting: Emergency Medicine

## 2019-11-14 ENCOUNTER — Other Ambulatory Visit: Payer: Self-pay

## 2019-11-14 DIAGNOSIS — R05 Cough: Secondary | ICD-10-CM | POA: Insufficient documentation

## 2019-11-14 DIAGNOSIS — J029 Acute pharyngitis, unspecified: Secondary | ICD-10-CM

## 2019-11-14 DIAGNOSIS — R070 Pain in throat: Secondary | ICD-10-CM | POA: Insufficient documentation

## 2019-11-14 DIAGNOSIS — B349 Viral infection, unspecified: Secondary | ICD-10-CM

## 2019-11-14 DIAGNOSIS — R52 Pain, unspecified: Secondary | ICD-10-CM

## 2019-11-14 DIAGNOSIS — R509 Fever, unspecified: Secondary | ICD-10-CM

## 2019-11-14 DIAGNOSIS — R059 Cough, unspecified: Secondary | ICD-10-CM

## 2019-11-14 DIAGNOSIS — Z20822 Contact with and (suspected) exposure to covid-19: Secondary | ICD-10-CM | POA: Insufficient documentation

## 2019-11-14 DIAGNOSIS — R0981 Nasal congestion: Secondary | ICD-10-CM | POA: Insufficient documentation

## 2019-11-14 DIAGNOSIS — F909 Attention-deficit hyperactivity disorder, unspecified type: Secondary | ICD-10-CM | POA: Insufficient documentation

## 2019-11-14 LAB — POCT RAPID STREP A: Streptococcus, Group A Screen (Direct): NEGATIVE

## 2019-11-14 MED ORDER — ACETAMINOPHEN 325 MG PO TABS
650.0000 mg | ORAL_TABLET | Freq: Once | ORAL | Status: AC
  Administered 2019-11-14: 20:00:00 650 mg via ORAL

## 2019-11-14 MED ORDER — CETIRIZINE HCL 10 MG PO TABS: 10.0000 mg | ORAL_TABLET | Freq: Every day | ORAL | 0 refills | Status: DC

## 2019-11-14 MED ORDER — IBUPROFEN 600 MG PO TABS: 600.0000 mg | ORAL_TABLET | Freq: Three times a day (TID) | ORAL | 0 refills | Status: DC | PRN

## 2019-11-14 MED ORDER — BENZONATATE 100 MG PO CAPS
100.0000 mg | ORAL_CAPSULE | Freq: Three times a day (TID) | ORAL | 0 refills | Status: DC | PRN
Start: 2019-11-14 — End: 2020-11-22

## 2019-11-14 MED ORDER — PSEUDOEPHEDRINE HCL 60 MG PO TABS: 60.0000 mg | ORAL_TABLET | Freq: Three times a day (TID) | ORAL | 0 refills | Status: DC | PRN

## 2019-11-14 MED ORDER — PROMETHAZINE-DM 6.25-15 MG/5ML PO SYRP: 5.0000 mL | ORAL_SOLUTION | Freq: Every evening | ORAL | 0 refills | Status: DC | PRN

## 2019-11-14 MED ORDER — ACETAMINOPHEN 325 MG PO TABS
ORAL_TABLET | ORAL | Status: AC
  Filled 2019-11-14: qty 2

## 2019-11-14 NOTE — Discharge Instructions (Addendum)

## 2019-11-14 NOTE — ED Triage Notes (Signed)
Pt c/o sore throat onset last night. He states today at work he began to experience bodyaches and felt hot so he left early. His fever was 103 F. He states he has not had any over the counter medications.

## 2019-11-14 NOTE — ED Provider Notes (Signed)
[facility]   MRN: [id number] DOB: 06-Dec-1995  Subjective:   Terrence Waters is a 24 y.o. male presenting for 2-day history of acute onset throat pain.  Symptoms progressed to body aches, runny and stuffy nose, cough, fatigue and malaise.  Patient has not had Covid vaccination.  Has not taken medications for relief.  No current facility-administered medications for this encounter. No current outpatient medications on file.   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  Objective:   Vitals: Pulse 91    Temp (!) 101.9 F (38.8 C) (Oral)    Resp 19    SpO2 100%   Physical Exam Constitutional:      General: He is not in acute distress.    Appearance: Normal appearance. He is well-developed and normal weight. He is not ill-appearing, toxic-appearing or diaphoretic.  HENT:     Head: Normocephalic and atraumatic.     Right Ear: Tympanic membrane, ear canal and external ear normal. There is no impacted cerumen.     Left Ear: Tympanic membrane, ear canal and external ear normal. There is no impacted cerumen.     Nose: Nose normal. No congestion or rhinorrhea.     Mouth/Throat:     Mouth: Mucous membranes are moist.     Pharynx: Oropharynx is clear. Posterior oropharyngeal erythema present. No pharyngeal swelling, oropharyngeal exudate or uvula swelling.  Eyes:     General: No scleral icterus.       Right eye: No discharge.         Left eye: No discharge.     Extraocular Movements: Extraocular movements intact.     Conjunctiva/sclera: Conjunctivae normal.     Pupils: Pupils are equal, round, and reactive to light.  Cardiovascular:     Rate and Rhythm: Normal rate and regular rhythm.     Heart sounds: Normal heart sounds. No murmur heard.  No friction rub. No gallop.   Pulmonary:     Effort: Pulmonary effort is normal. No respiratory distress.     Breath sounds: Normal breath sounds. No stridor. No wheezing, rhonchi or rales.  Musculoskeletal:     Cervical back: Normal range of motion and neck supple. No rigidity. No muscular tenderness.  Neurological:     General: No focal deficit present.     Mental Status: He is alert and oriented to person, place, and time.  Psychiatric:        Mood and Affect: Mood normal.        Behavior: Behavior normal.        Thought Content: Thought content normal.     Results for orders placed or performed during the hospital encounter of 11/14/19 (from the past 24 hour(s))  POCT rapid strep A [facility])  Status: None   Collection Time: 11/14/19  8:07 PM  Result Value Ref Range   Streptococcus, Group A Screen (Direct) NEGATIVE NEGATIVE    Assessment and Plan :   PDMP not reviewed this encounter.  1. Viral illness   2. Body aches   3. Cough   4. Throat pain   5. Nasal congestion     Will manage for viral illness such as viral URI, viral syndrome, viral rhinitis, COVID-19. Counseled patient on nature of COVID-19 including modes of transmission, diagnostic testing, management and supportive care.  Offered scripts for symptomatic relief. COVID 19 testing is pending. Counseled patient on potential for adverse effects with medications prescribed/recommended today, ER and return-to-clinic precautions discussed, patient verbalized understanding.     Wallis Bamberg, PA-C 11/14/19 2014

## 2019-11-15 LAB — SARS CORONAVIRUS 2 (TAT 6-24 HRS): SARS Coronavirus 2: NEGATIVE

## 2019-11-17 LAB — CULTURE, GROUP A STREP (THRC)

## 2019-11-18 ENCOUNTER — Other Ambulatory Visit: Payer: Self-pay

## 2019-11-18 ENCOUNTER — Ambulatory Visit (HOSPITAL_COMMUNITY)
Admission: EM | Admit: 2019-11-18 | Discharge: 2019-11-18 | Disposition: A | Discharge status: Home / Self Care | Payer: Self-pay

## 2019-11-18 ENCOUNTER — Encounter (HOSPITAL_COMMUNITY): Payer: Self-pay

## 2019-11-18 DIAGNOSIS — B084 Enteroviral vesicular stomatitis with exanthem: Secondary | ICD-10-CM

## 2019-11-18 DIAGNOSIS — S39011A Strain of muscle, fascia and tendon of abdomen, initial encounter: Secondary | ICD-10-CM

## 2019-11-18 NOTE — ED Triage Notes (Signed)
Pt is here with a rash that is on his face and hands, also is having abdominal pain started Saturday, pt has taken Tylenol to relieve discomfort.

## 2019-11-18 NOTE — ED Provider Notes (Addendum)
[facility]    CSN: [id number] Arrival date & time: 11/18/19  1549      History   Chief Complaint Chief Complaint  Patient presents with  . Rash  . Abdominal Pain    HPI Terrence Waters is a 24 y.o. male.   Pt complains non painful rash around his mouth and on his hands that started several days ago.  He was seen last Thursday for scratchy throat, subjective fever which resolved one day later then he noticed the rash.  His 1 yo son recently diagnosed with HFM, sx have now resolved.  Pt has tried nothing for the sx.  He also complains of left lower abdominal pain that started about 1 week ago and is worse with movement.  Denies n/v/d, dysuria, radiation of pain.  Pt works in a warehouse which requires heavy lifting.  He reports pain is worse with lifting.  He has taken tylenol with improvement.      Past Medical History:  Diagnosis Date  . ADHD     Patient Active Problem List   Diagnosis Date Noted  . Suicidal ideation     Past Surgical History:  Procedure Laterality Date  . 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 Authorizing Provider  FLUoxetine (PROZAC) 20 MG capsule Take by mouth. 10/10/16  Yes [provider]  benzonatate (TESSALON) 100 MG capsule Take 1-2 capsules (100-200 mg total) by mouth 3 (three) times daily as needed. 11/14/19   Wallis Bamberg, PA-C  cetirizine (ZYRTEC ALLERGY) 10 MG tablet Take 1 tablet (10 mg total) by mouth daily. 11/14/19   Wallis Bamberg, PA-C  ibuprofen (ADVIL) 600 MG tablet Take 1 tablet (600 mg total) by mouth every 8 (eight) hours as needed. 11/14/19   Wallis Bamberg, PA-C  promethazine-dextromethorphan (PROMETHAZINE-DM) 6.25-15 MG/5ML syrup Take 5 mLs by mouth at bedtime as needed for cough. 11/14/19   Wallis Bamberg, PA-C  pseudoephedrine (SUDAFED) 60 MG tablet Take 1 tablet (60 mg total) by mouth  every 8 (eight) hours as needed for congestion. 11/14/19   Wallis Bamberg, PA-C    Family History Family History  Problem Relation Age of Onset  . Healthy Mother   . Healthy Father     Social History Social History   Tobacco Use  . Smoking status: Never Smoker  . Smokeless tobacco: Never Used  Vaping Use  . Vaping Use: Never used  Substance Use Topics  . Alcohol use: No  . Drug use: Yes    Types: Marijuana     Allergies   Banana   Review of Systems Review of Systems  Constitutional: Negative for chills and fever.  HENT: Negative for ear pain and sore throat.   Eyes: Negative for pain and visual disturbance.  Respiratory: Negative for cough and shortness of breath.   Cardiovascular: Negative for chest pain and palpitations.  Gastrointestinal: Positive for abdominal pain (left lower abdominal pain). Negative for blood in stool, constipation, diarrhea, nausea and vomiting.  Genitourinary: Negative for dysuria and hematuria.  Musculoskeletal: Negative for arthralgias and back pain.  Skin: Positive for rash (around mouth, bilateral hands). Negative for color change.  Neurological: Negative for seizures and syncope.  All other systems reviewed and are negative.    Physical Exam Triage Vital Signs ED Triage Vitals  Enc Vitals Group  BP 11/18/19 1712 118/79     Pulse Rate 11/18/19 1712 71     Resp 11/18/19 1712 17     Temp 11/18/19 1712 98.8 F (37.1 C)     Temp Source 11/18/19 1712 Oral     SpO2 11/18/19 1712 100 %     Weight --      Height --      Head Circumference --      Peak Flow --      Pain Score 11/18/19 1709 2     Pain Loc --      Pain Edu? --      Excl. in GC? --    No data found.  Updated Vital Signs BP 118/79 (BP Location: Right Arm)   Pulse 71   Temp 98.8 F (37.1 C) (Oral)   Resp 17   SpO2 100%   Visual Acuity Right Eye Distance:   Left Eye Distance:   Bilateral Distance:    Right Eye Near:   Left Eye Near:    Bilateral Near:       Physical Exam Vitals and nursing note reviewed.  Constitutional:      Appearance: He is well-developed.  HENT:     Head: Normocephalic and atraumatic.     Comments: Vesicular rash around the mouth and base of nose without crusting, discharge.  Eyes:     Conjunctiva/sclera: Conjunctivae normal.  Cardiovascular:     Rate and Rhythm: Normal rate and regular rhythm.     Heart sounds: No murmur heard.   Pulmonary:     Effort: Pulmonary effort is normal. No respiratory distress.     Breath sounds: Normal breath sounds.  Abdominal:     Palpations: Abdomen is soft.     Tenderness: There is no abdominal tenderness.  Musculoskeletal:     Cervical back: Neck supple.  Skin:    General: Skin is warm and dry.     Comments: Vesicular exanthem to bilateral palms and interdigit spaces consistent with HFM  Neurological:     Mental Status: He is alert.      UC Treatments / Results  Labs (all labs ordered are listed, but only abnormal results are displayed) Labs Reviewed - No data to display  EKG   Radiology No results found.  Procedures Procedures (including critical care time)  Medications Ordered in UC Medications - No data to display  Initial Impression / Assessment and Plan / UC Course  I have reviewed the triage vital signs and the nursing notes.  Pertinent labs & imaging results that were available during my care of the patient were reviewed by me and considered in my medical decision making (see chart for details).     Rash to hands and around mouth consistent with HFM.  1yo son recently diagnosed with HFM as well.  Viral rash which will run it's course.   Left lower abdominal pain that is worse with movement and lifting most likely abdominal muscle wall strain.  No hernia noted.  Advised Ibuprofen as needed and rest.  Will provide a work note for pt.  Return precautions discussed.  Final Clinical Impressions(s) / UC Diagnoses   Final diagnoses:  None   Discharge  Instructions   None    ED Prescriptions    None     PDMP not reviewed this encounter.   Jodell Cipro, PA-C 11/18/19 1743    Jodell Cipro, PA-C 11/18/19 1752

## 2019-11-18 NOTE — Discharge Instructions (Signed)
Recommend Ibuprofen as needed for abdominal wall pain.  Viral rash, hand foot and mouth, will run it's course similar to your son's rash.

## 2019-11-20 ENCOUNTER — Encounter (HOSPITAL_COMMUNITY): Payer: Self-pay

## 2019-11-20 ENCOUNTER — Emergency Department (HOSPITAL_COMMUNITY)
Admission: EM | Admit: 2019-11-20 | Discharge: 2019-11-20 | Disposition: A | Discharge status: Home / Self Care | Payer: Self-pay | Attending: Emergency Medicine | Admitting: Emergency Medicine

## 2019-11-20 ENCOUNTER — Emergency Department (HOSPITAL_COMMUNITY): Payer: Self-pay

## 2019-11-20 DIAGNOSIS — R1032 Left lower quadrant pain: Secondary | ICD-10-CM | POA: Insufficient documentation

## 2019-11-20 DIAGNOSIS — R109 Unspecified abdominal pain: Secondary | ICD-10-CM

## 2019-11-20 DIAGNOSIS — N50812 Left testicular pain: Secondary | ICD-10-CM | POA: Insufficient documentation

## 2019-11-20 LAB — URINALYSIS, ROUTINE W REFLEX MICROSCOPIC
Bilirubin Urine: NEGATIVE
Glucose, UA: NEGATIVE mg/dL
Hgb urine dipstick: NEGATIVE
Ketones, ur: NEGATIVE mg/dL
Leukocytes,Ua: NEGATIVE
Nitrite: NEGATIVE
Protein, ur: NEGATIVE mg/dL
Specific Gravity, Urine: 1.019 (ref 1.005–1.030)
pH: 6 (ref 5.0–8.0)

## 2019-11-20 MED ORDER — METHOCARBAMOL 500 MG PO TABS
500.0000 mg | ORAL_TABLET | Freq: Three times a day (TID) | ORAL | 0 refills | Status: DC | PRN
Start: 2019-11-20 — End: 2020-11-22

## 2019-11-20 NOTE — Discharge Instructions (Signed)
Follow-up with your doctor as needed.  Return for worsening abdominal pain or fevers.

## 2019-11-20 NOTE — ED Triage Notes (Addendum)
Patient arrived via [facility] from home.   Called out for left lower quadrant pain X1 week. Patient reports a lot of lifting at his job.   Pain increases with movement and walking.  Tender to palpation.   Denies N/V or diarrhea  Denies urinary symptoms.   Patient seen at Urgent Care for foot and mouth and abdominal pain.   Patient was told pain was from musculoskeletal.

## 2019-11-20 NOTE — ED Provider Notes (Signed)
[facility]-EMERGENCY DEPT Provider Note   CSN: [id number] Arrival date & time: 11/20/19  0731     History Chief Complaint  Patient presents with   Abdominal Pain    Terrence Waters is a 24 y.o. male.  HPI Patient presents with abdominal pain.  Has had for around a week now.  Left lower abdomen.  Has been seen in urgent care because he also had a rash.  Family members that had hand-foot-and-mouth disease.  Diagnosed with that and muscular strain from his job that involves lifting.  Continued pain.  States the pain is worse after standing and walking.  Improves with sitting.  No fevers or chills.  No dysuria.  No nausea vomiting or diarrhea.  No change in bowel habits.  Does have mild pain in the left testicle.    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 Authorizing Provider  benzonatate (TESSALON) 100 MG capsule Take 1-2 capsules (100-200 mg total) by mouth 3 (three) times daily as needed. 11/14/19   Wallis Bamberg, PA-C  cetirizine (ZYRTEC ALLERGY) 10 MG tablet Take 1 tablet (10 mg total) by mouth daily. 11/14/19   Wallis Bamberg, PA-C  FLUoxetine (PROZAC) 20 MG capsule Take by mouth. 10/10/16   [provider]  ibuprofen (ADVIL) 600 MG tablet Take 1 tablet (600 mg total) by mouth every 8 (eight) hours as needed. 11/14/19   Wallis Bamberg, PA-C    methocarbamol (ROBAXIN) 500 MG tablet Take 1 tablet (500 mg total) by mouth every 8 (eight) hours as needed for muscle spasms. 11/20/19   Benjiman Core, MD  promethazine-dextromethorphan (PROMETHAZINE-DM) 6.25-15 MG/5ML syrup Take 5 mLs by mouth at bedtime as needed for cough. 11/14/19   Wallis Bamberg, PA-C  pseudoephedrine (SUDAFED) 60 MG tablet Take 1 tablet (60 mg total) by mouth every 8 (eight) hours as needed for congestion. 11/14/19   Wallis Bamberg, PA-C    Allergies    Banana  Review of Systems   Review of Systems  Constitutional: Negative for appetite change.  HENT: Negative for congestion.   Respiratory: Negative for shortness of breath.   Gastrointestinal: Positive for abdominal pain.  Genitourinary: Positive for testicular pain.  Musculoskeletal: Negative for back pain.  Skin: Negative for rash.  Neurological: Negative for weakness.  Psychiatric/Behavioral: Negative for confusion.    Physical Exam Updated Vital Signs BP (!) 130/69    Pulse 70    Temp 97.8 F (36.6 C)    Resp 17    Ht 6' (1.829 m)    Wt 77.1 kg    SpO2 100%    BMI 23.06 kg/m   Physical Exam Vitals and nursing note reviewed.  HENT:  Head: Normocephalic.  Cardiovascular:     Rate and Rhythm: Normal rate.  Pulmonary:     Effort: Pulmonary effort is normal.  Abdominal:     Tenderness: There is abdominal tenderness.     Comments: Left lower abdominal wall tenderness.  No hernia palpated.  Tenderness over musculature but when palpated deep from a more medial approach did not involve the musculature there is not the same amount of tenderness.  Genitourinary:    Comments: Some tenderness to left testicle.  No hernia palpated.  Normal lie.  No penile discharge. Skin:    General: Skin is warm.     Capillary Refill: Capillary refill takes less than 2 seconds.  Neurological:     Mental Status: He is alert and oriented to person, place, and time.     ED Results / Procedures / Treatments   Labs (all  labs ordered are listed, but only abnormal results are displayed) Labs Reviewed  URINALYSIS, ROUTINE W REFLEX MICROSCOPIC    EKG None  Radiology US SCROTUM W/DOPPLER  Result Date: 11/20/2019 CLINICAL DATA:  Left lower quadrant pain.  Testicle pain. EXAM: SCROTAL ULTRASOUND DOPPLER ULTRASOUND OF THE TESTICLES TECHNIQUE: Complete ultrasound examination of the testicles, epididymis, and other scrotal structures was performed. Color and spectral Doppler ultrasound were also utilized to evaluate blood flow to the testicles. COMPARISON:  None. FINDINGS: Right testicle Measurements: 4.4 x 2.1 x 3.3 cm. No mass or microlithiasis visualized. Left testicle Measurements: 4.6 x 2.0 x 2.9 cm. No mass or microlithiasis visualized. Right epididymis:  Normal in size and appearance. Left epididymis:  Normal in size and appearance. Hydrocele:  None visualized. Varicocele:  None visualized. Pulsed Doppler interrogation of both testes demonstrates normal low resistance arterial and venous waveforms bilaterally. IMPRESSION: 1. Normal exam. 2. No signs of testicular torsion or mass. Electronically Signed   By: Signa Kell M.D.   On: 11/20/2019 10:25    Procedures Procedures (including critical care time)  Medications Ordered in ED Medications - No data to display  ED Course  I have reviewed the triage vital signs and the nursing notes.  Pertinent labs & imaging results that were available during my care of the patient were reviewed by me and considered in my medical decision making (see chart for details).    MDM Rules/Calculators/A&P                          Patient with abdominal pain.  However does have some testicular tenderness.  Had been recently seen and thought that it was abdominal wall pain.  Urine reassuring.  Ultrasound done also shows no testicular issues.  Tender Ness on abdominal wall was only palpation of the musculature.  If I approach for more of a medial aspect and avoid the musculature  for that area there is no tenderness.  Patient also had hand-foot-and-mouth disease that I think is probably unrelated.  Will discharge home. Final Clinical Impression(s) / ED Diagnoses Final diagnoses:  Abdominal wall pain    Rx / DC Orders ED Discharge Orders         Ordered    methocarbamol (ROBAXIN) 500 MG tablet  Every 8 hours PRN     Discontinue  Reprint     11/20/19 1032           Benjiman Core, MD 11/21/19 1629

## 2020-11-15 ENCOUNTER — Other Ambulatory Visit: Payer: Self-pay

## 2020-11-15 ENCOUNTER — Ambulatory Visit (HOSPITAL_COMMUNITY)
Admission: EM | Admit: 2020-11-15 | Discharge: 2020-11-15 | Disposition: A | Discharge status: Home / Self Care | Payer: Self-pay | Attending: Emergency Medicine | Admitting: Emergency Medicine

## 2020-11-15 ENCOUNTER — Encounter (HOSPITAL_COMMUNITY): Payer: Self-pay

## 2020-11-15 DIAGNOSIS — S86892A Other injury of other muscle(s) and tendon(s) at lower leg level, left leg, initial encounter: Secondary | ICD-10-CM

## 2020-11-15 NOTE — ED Triage Notes (Signed)
Pt c/o left leg pain that started on Thursday.  Interventions: Advil- somewhat

## 2020-11-15 NOTE — ED Provider Notes (Signed)
[facility]    CSN: [id number] Arrival date & time: 11/15/20  1449      History   Chief Complaint Chief Complaint  Patient presents with   Leg Pain    HPI Terrence Waters is a 25 y.o. male.   HPI Terrence Waters is a 25 y.o. 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 No specific known injury but reports working longer hours than he is use to.  Pain is 10/10 with certain movements. He has taken tylenol as needed. Denies bruising, redness, or swelling. No hx of similar symptoms. No joint pain.   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 Authorizing Provider  benzonatate (TESSALON) 100 MG capsule Take 1-2 capsules (100-200 mg total) by mouth 3 (three) times daily as needed. 11/14/19   Wallis Bamberg, PA-C  cetirizine (ZYRTEC ALLERGY) 10 MG tablet Take 1 tablet (10 mg total) by mouth daily. 11/14/19   Wallis Bamberg, PA-C  FLUoxetine (PROZAC) 20 MG capsule Take by mouth. 10/10/16   [provider]  ibuprofen (ADVIL) 600 MG tablet Take 1 tablet (600 mg total) by mouth every 8 (eight) hours as needed. 11/14/19   Wallis Bamberg, PA-C  methocarbamol (ROBAXIN) 500 MG tablet Take 1 tablet (500 mg total) by mouth every 8 (eight) hours as needed for muscle spasms. 11/20/19   Benjiman Core, MD  promethazine-dextromethorphan (PROMETHAZINE-DM) 6.25-15 MG/5ML syrup Take 5 mLs by mouth at bedtime as needed for cough. 11/14/19   Wallis Bamberg, PA-C  pseudoephedrine (SUDAFED) 60 MG tablet Take 1 tablet (60 mg total) by mouth every 8 (eight) hours as needed for  congestion. 11/14/19   Wallis Bamberg, PA-C    Family History Family History  Problem Relation Age of Onset   Healthy Mother    Healthy Father     Social History Social History   Tobacco Use   Smoking status: Never   Smokeless tobacco: Never  Vaping Use   Vaping Use: Never used  Substance Use Topics   Alcohol use: Yes   Drug use: Yes    Types: Marijuana     Allergies   Banana   Review of Systems Review of Systems  Constitutional:  Negative for chills and fever.  Musculoskeletal:  Positive for myalgias. Negative for arthralgias, gait problem and joint swelling.  Skin:  Negative for color change, rash and wound.    Physical Exam Triage Vital Signs ED Triage Vitals  Enc Vitals Group     BP 11/15/20 1535 125/72     Pulse Rate 11/15/20 1535 74     Resp 11/15/20 1535 18     Temp 11/15/20 1535 97.9 F (36.6 C)     Temp Source 11/15/20 1535 Oral     SpO2 11/15/20 1535 100 %     Weight --      Height --      Head Circumference --      Peak Flow --      Pain  Score 11/15/20 1532 10     Pain Loc --      Pain Edu? --      Excl. in GC? --    No data found.  Updated Vital Signs BP 125/72 (BP Location: Left Arm)   Pulse 74   Temp 97.9 F (36.6 C) (Oral)   Resp 18   SpO2 100%   Visual Acuity Right Eye Distance:   Left Eye Distance:   Bilateral Distance:    Right Eye Near:   Left Eye Near:    Bilateral Near:     Physical Exam Vitals and nursing note reviewed.  Constitutional:      General: He is not in acute distress.    Appearance: Normal appearance. He is well-developed. He is not ill-appearing, toxic-appearing or diaphoretic.  HENT:     Head: Normocephalic and atraumatic.  Cardiovascular:     Rate and Rhythm: Normal rate.  Pulmonary:     Effort: Pulmonary effort is normal.  Musculoskeletal:        General: Tenderness present. No swelling. Normal range of motion.     Cervical back: Normal range of motion.     Comments: Left leg: full ROM hip, knee  and ankle. Tenderness along anterior tibialis muscle. Calf is soft, non-tender.    Skin:    General: Skin is warm and dry.     Capillary Refill: Capillary refill takes less than 2 seconds.     Findings: No erythema or rash.  Neurological:     General: No focal deficit present.     Mental Status: He is alert and oriented to person, place, and time.  Psychiatric:        Behavior: Behavior normal.     UC Treatments / Results  Labs (all labs ordered are listed, but only abnormal results are displayed) Labs Reviewed - No data to display  EKG   Radiology No results found.  Procedures Procedures (including critical care time)  Medications Ordered in UC Medications - No data to display  Initial Impression / Assessment and Plan / UC Course  I have reviewed the triage vital signs and the nursing notes.  Pertinent labs & imaging results that were available during my care of the patient were reviewed by me and considered in my medical decision making (see chart for details).     Assessment and Plan  Left medial tibial stress syndrome, initial encounter. Tenderness to anterior lower leg along anterior tibialis muscle. Skin exam normal. Calf soft, non-tender. No evidence of infection or DVT at this time.  Encouraged RICE home treatment with gentle stretching and massage  Alternating acetaminophen and ibuprofen  F/u with PCP later this week as needed AVS and work note provided for tomorrow  Final Clinical Impressions(s) / UC Diagnoses   Final diagnoses:  Left medial tibial stress syndrome, initial encounter     Discharge Instructions       You may take over the counter acetaminophen (tylenol) 500mg  every 4-6 hours and ibuprofen 400-600mg  every 6-8 hours for pain and swelling. Rice, apply cool compress, and elevate your leg 3 times daily for 15 minutes at a time, especially after a long shift. Gentle stretching and massage of the area can give relief as well.  Follow up  with primary care later this week if not improving, sooner if worsening.      ED Prescriptions   None    PDMP not reviewed this encounter.   , Lurene Shadow 11/15/20 [phone]

## 2020-11-15 NOTE — Discharge Instructions (Addendum)
  You may take over the counter acetaminophen (tylenol) 500mg  every 4-6 hours and ibuprofen 400-600mg  every 6-8 hours for pain and swelling. Rice, apply cool compress, and elevate your leg 3 times daily for 15 minutes at a time, especially after a long shift. Gentle stretching and massage of the area can give relief as well.  Follow up with primary care later this week if not improving, sooner if worsening.

## 2020-11-22 ENCOUNTER — Ambulatory Visit (HOSPITAL_COMMUNITY)
Admission: EM | Admit: 2020-11-22 | Discharge: 2020-11-22 | Disposition: A | Discharge status: Home / Self Care | Payer: Self-pay | Attending: Student | Admitting: Student

## 2020-11-22 ENCOUNTER — Encounter (HOSPITAL_COMMUNITY): Payer: Self-pay | Admitting: *Deleted

## 2020-11-22 ENCOUNTER — Other Ambulatory Visit: Payer: Self-pay

## 2020-11-22 DIAGNOSIS — S86892A Other injury of other muscle(s) and tendon(s) at lower leg level, left leg, initial encounter: Secondary | ICD-10-CM

## 2020-11-22 MED ORDER — TIZANIDINE HCL 2 MG PO TABS
2.0000 mg | ORAL_TABLET | Freq: Four times a day (QID) | ORAL | 0 refills | Status: DC | PRN
Start: 2020-11-22 — End: 2024-03-03

## 2020-11-22 NOTE — Discharge Instructions (Addendum)
-  Start the muscle relaxer-Zanaflex (tizanidine), up to 3 times daily for muscle spasms and pain.  This can make you drowsy, so take at bedtime or when you do not need to drive or operate machinery. -Continue tylenol and ibuprofen -If symptoms persist in 4-5 days, follow-up with an orthopedist. I recommend [facility] at [address], [place], [place] [address]. You can schedule an appointment by calling [phone]) or online ([email or web address]), but they also have a walk-in clinic M-F 8a-8p and Sat 10a-3p.

## 2020-11-22 NOTE — ED Provider Notes (Signed)
[facility]    CSN: [id number] Arrival date & time: 11/22/20  1423      History   Chief Complaint Chief Complaint  Patient presents with   Leg Pain    HPI Terrence Waters is a 24 y.o. male presenting with leg pain- L leg.  Was last evaluated at our urgent care on 7/24 and was diagnosed with left tibial stress syndrome, treated with Tylenol and ibuprofen.  He is again presenting with the symptoms today.  No new trauma, but has been working 10 hour days on his feet despite the injury. Tylenol and ibuprofen providing minimal relief. Denies changes in pain or pain elsewhere. Denies numbness/tingling. 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 Authorizing Provider  tiZANidine (ZANAFLEX) 2 MG tablet Take 1 tablet (2 mg total) by mouth every 6 (six) hours as needed for muscle spasms. 11/22/20  Yes Rhys Martini, PA-C  cetirizine (ZYRTEC ALLERGY) 10 MG tablet Take 1 tablet (10 mg total) by mouth daily. 11/14/19   Wallis Bamberg, PA-C  pseudoephedrine (SUDAFED) 60 MG tablet Take 1 tablet (60 mg total) by mouth every 8 (eight) hours as needed for congestion. 11/14/19   Wallis Bamberg, PA-C    Family History Family History  Problem Relation Age of Onset   Healthy Mother    Healthy Father     Social History Social History   Tobacco Use   Smoking status: Never   Smokeless tobacco: Never  Vaping Use   Vaping Use: Never used  Substance Use Topics   Alcohol use: Yes   Drug use: Yes    Types: Marijuana     Allergies   Banana   Review of Systems Review of Systems  Musculoskeletal:        L leg  pain  All other systems reviewed and are negative.   Physical Exam Triage Vital Signs ED Triage Vitals  Enc Vitals Group     BP      Pulse      Resp      Temp      Temp src      SpO2      Weight      Height      Head Circumference      Peak Flow      Pain Score      Pain Loc      Pain Edu?      Excl. in GC?    No data found.  Updated Vital Signs BP 118/61   Pulse 69   Temp 99.1 F (37.3 C)   Resp 18   SpO2 100%   Visual Acuity Right Eye Distance:   Left Eye Distance:   Bilateral Distance:    Right Eye Near:   Left Eye Near:    Bilateral Near:     Physical Exam Vitals reviewed.  Constitutional:      General: He is not in acute distress.    Appearance: Normal appearance. He is not ill-appearing or diaphoretic.  HENT:     Head: Normocephalic and atraumatic.  Cardiovascular:     Rate and Rhythm: Normal rate and regular rhythm.     Heart sounds: Normal heart sounds.  Pulmonary:     Effort: Pulmonary effort is normal.     Breath sounds: Normal breath sounds.  Musculoskeletal:     Comments: L leg- ROM hip, knee, ankle is full and without pain. Tenderness to palpation along anterior tibialis muscle. Calf is soft, without tenderness or effusion. Negative homan sign. DP 2+, cap refill <2 seconds. Well healed surgical scar along anterior tibia midshaft, without tenderness.   Skin:    General: Skin is warm.  Neurological:     General: No focal deficit present.     Mental Status: He is alert and oriented to person, place, and time.  Psychiatric:        Mood and Affect: Mood normal.        Behavior: Behavior normal.        Thought Content: Thought content normal.        Judgment: Judgment normal.     UC Treatments / Results  Labs (all labs ordered are listed, but only abnormal results are displayed) Labs Reviewed - No data to display  EKG   Radiology No results found.  Procedures Procedures (including critical care time)  Medications Ordered in  UC Medications - No data to display  Initial Impression / Assessment and Plan / UC Course  I have reviewed the triage vital signs and the nursing notes.  Pertinent labs & imaging results that were available during my care of the patient were reviewed by me and considered in my medical decision making (see chart for details).     This patient is a very pleasant 25 y.o. year old male presenting with L tibial stress syndrome. Neurovascularly intact. 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  Trial of muscle Tizanidine.  F/u with ortho if symptoms persist.   Rest. Work note provided. ED return precautions discussed. Patient verbalizes understanding and agreement.    Final Clinical Impressions(s) / UC Diagnoses   Final diagnoses:  Left medial tibial stress syndrome, initial encounter     Discharge Instructions      -Start the muscle relaxer-Zanaflex (tizanidine), up to 3 times daily for muscle spasms and pain.  This can make you drowsy, so take at bedtime or when you do not need to drive or operate machinery. -Continue tylenol and ibuprofen -If symptoms persist in 4-5 days, follow-up with an orthopedist. I recommend [facility] at [address] [place], [place] [address]. You can schedule an appointment by calling [phone]) or online ([email or web address]), but they also have a walk-in clinic M-F 8a-8p and Sat 10a-3p.      ED Prescriptions     Medication Sig Dispense Auth. Provider   tiZANidine (ZANAFLEX) 2 MG tablet Take 1 tablet (2 mg total) by mouth every 6 (six) hours as needed for muscle spasms. 21 tablet Rhys Martini, PA-C      PDMP not reviewed this encounter.   Rhys Martini, PA-C 11/22/20 1606

## 2020-11-22 NOTE — ED Triage Notes (Signed)
Pt reports being seen last week for leg pain . RT Leg pain has not improved and Pt is having a hard time working due to pain.

## 2020-11-24 ENCOUNTER — Encounter (HOSPITAL_COMMUNITY): Payer: Self-pay | Admitting: Emergency Medicine

## 2020-11-24 ENCOUNTER — Ambulatory Visit (HOSPITAL_COMMUNITY)
Admission: EM | Admit: 2020-11-24 | Discharge: 2020-11-24 | Disposition: A | Discharge status: Home / Self Care | Payer: Self-pay | Attending: Emergency Medicine | Admitting: Emergency Medicine

## 2020-11-24 DIAGNOSIS — S86892D Other injury of other muscle(s) and tendon(s) at lower leg level, left leg, subsequent encounter: Secondary | ICD-10-CM

## 2020-11-24 NOTE — Discharge Instructions (Addendum)
Continue Tylenol/ibuprofen, Zanaflex as needed.  Continue ice, elevation is much as possible.  You are cleared to return to the work steps program.  Please follow-up with [facility] if you are not better in a week.  Below is a list of primary care practices who are taking new patients for you to follow-up with.  [facility] internal medicine clinic Ground Floor - [facility], [address] [place], [place], [place] [address] [phone]  [facility] Primary Care at [facility] [address] [place], [place] [address] [phone]  [facility] [address] [place], [place] [address] [phone]  [facility] [phone] [address] [place] [address]  [facility]: [address] [place] [address]  [phone]  [facility]: [address] [place] [address]  [phone]  [facility] : [address] [place] [address] [phone]  [facility] Primary Care: [address] [place] [address] [phone]  [facility]: [address] [place] [address] [phone]  Dr. Oneal Grout [address] [facility] [place] [address]  [phone]  Go to www.goodrx.com  or www.costplusdrugs.com to look up your medications. This will give you a list of where you can find your prescriptions at the most affordable prices. Or ask the pharmacist what the cash price is, or if they have any other discount programs available to help make your medication more affordable. This can be less expensive than what you would pay with insurance.

## 2020-11-24 NOTE — ED Provider Notes (Signed)
HPI  SUBJECTIVE:  Terrence Waters is a 25 y.o. male who presents for clearance to return to work.  He has had left anterior left lower leg pain over the past 12 days after working 10-hour shifts on his feet.  He was seen here on 7/24 for left leg pain, thought to have left medial tibial stress syndrome sent home with Tylenol/ibuprofen.  Returned here on 7/31 for the same, was sent home with Zanaflex and told to follow-up with orthopedics if symptoms persist.  He states that the symptoms are much better, although they have not completely resolved.  They are present with walking only.  He denies ankle pain.  States that he needs a note clearing him to return to work without restrictions so that he can participate in a return to work program.  He has been taking Tylenol, ibuprofen, Zanaflex, Ace wrap, elevation and ice with improvement in symptoms.  Symptoms are worse with walking too much and with dorsiflexion.  No fevers, body ache trauma, color changes, calf Swelling, distal numbness or tingling.  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  Current Outpatient Medications:    tiZANidine (ZANAFLEX) 2 MG tablet, Take 1 tablet (2 mg total) by mouth every 6 (six) hours as needed for muscle spasms., Disp: 21 tablet, Rfl: 0  Allergies  Allergen Reactions   Banana Nausea And Vomiting     ROS  As noted in HPI.   Physical  Exam  BP 123/75 (BP Location: Right Arm)   Pulse 60   Temp 98.7 F (37.1 C) (Oral)   Resp 16   SpO2 100%   Constitutional: Well developed, well nourished, no acute distress Eyes:  EOMI, conjunctiva normal bilaterally HENT: Normocephalic, atraumatic,mucus membranes moist Respiratory: Normal inspiratory effort Cardiovascular: Normal rate GI: nondistended skin: No rash, skin intact Musculoskeletal: Tenderness left anterior tibialis.  Positive well-healed scar upper tibia.  Tibia otherwise nontender.  No signs of overlying infection.  Ankle nontender.  No pain with passive dorsiflexion/plantarflexion, inversion/eversion.  Calf nontender.  DP 2+.  Sensation grossly intact distally. Neurologic: Alert & oriented x 3, no focal neuro deficits Psychiatric: Speech and behavior appropriate   ED Course   Medications - No data to display  No orders of the defined types were placed in this encounter.   No results found for this or any previous visit (from the past 24 hour(s)). No results found.  ED Clinical Impression  1. Left medial tibial stress syndrome, subsequent encounter      ED Assessment/Plan  Previous records reviewed.  As noted in HPI.  Patient with left anterior tibialis stress syndrome.  States that he is getting better, so I do not think that an x-ray is necessary today.  Will write a work note  that releases him to participate in work steps which is a gradual return to work program.  Follow-up with Dr. Aundria Rud at [facility] if not getting better in a week.  Continue Tylenol/ibuprofen, Zanaflex.  Patient states he does not need a refill on these.  Also providing primary care list and will order assistance in finding a PMD.  Discussed MDM, treatment plan, and plan for follow-up with patient. patient agrees with plan.   No orders of the defined types were placed in this encounter.     *This clinic note was created using Dragon dictation software. Therefore, there may be  occasional mistakes despite careful proofreading.  ?    Domenick Gong, MD 11/25/20 [phone]

## 2020-11-24 NOTE — ED Triage Notes (Signed)
Pt presents for follow-up on leg pain. Requesting note to be released back to work.

## 2020-12-08 ENCOUNTER — Encounter: Payer: Self-pay | Admitting: *Deleted

## 2021-05-19 IMAGING — US US SCROTUM W/ DOPPLER COMPLETE
1 series · 14 of 25 positions shown · non-contrast
Comparison: None.

CLINICAL DATA: Left lower quadrant pain.  Testicle pain.

EXAM:
SCROTAL ULTRASOUND
DOPPLER ULTRASOUND OF THE TESTICLES
TECHNIQUE: Complete ultrasound examination of the testicles, epididymis, and
other scrotal structures was performed. Color and spectral Doppler
ultrasound were also utilized to evaluate blood flow to the
testicles.

[Series 1: us scrotum w/ doppler complete · 14 of 50 slices shown]
[im 1/50]
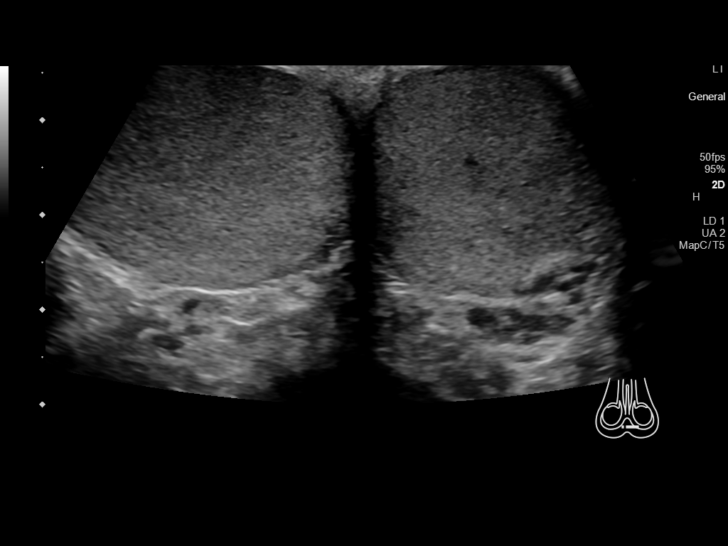
[im 5/50]
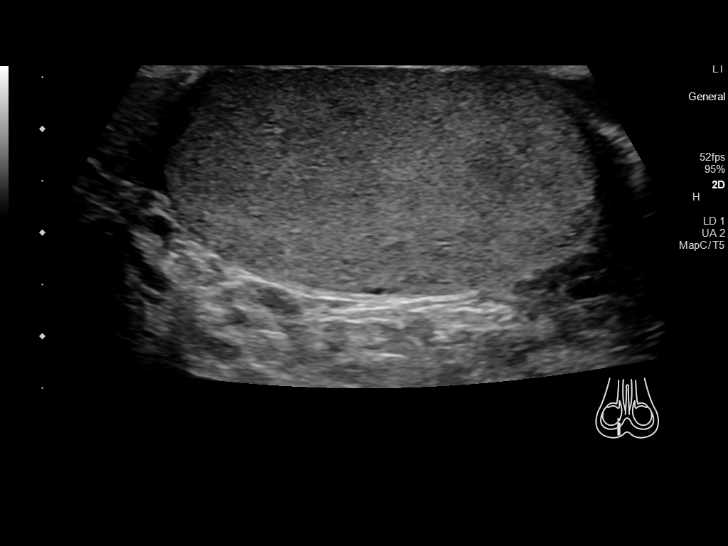
[im 9/50]
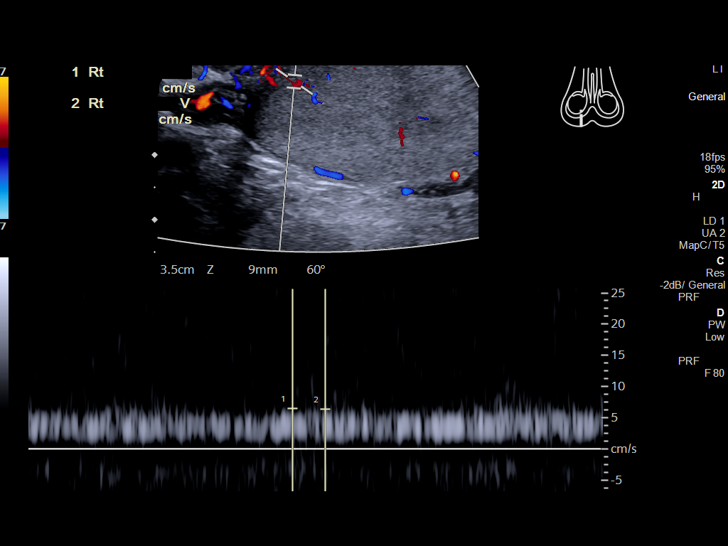
[im 13/50]
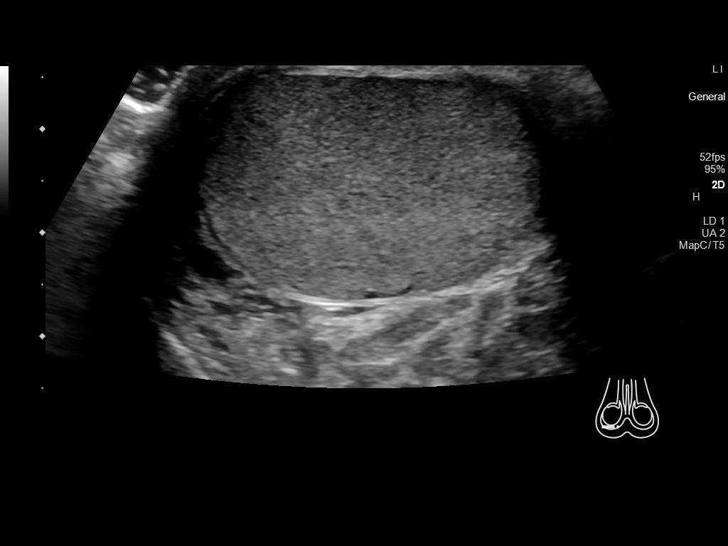
[im 17/50]
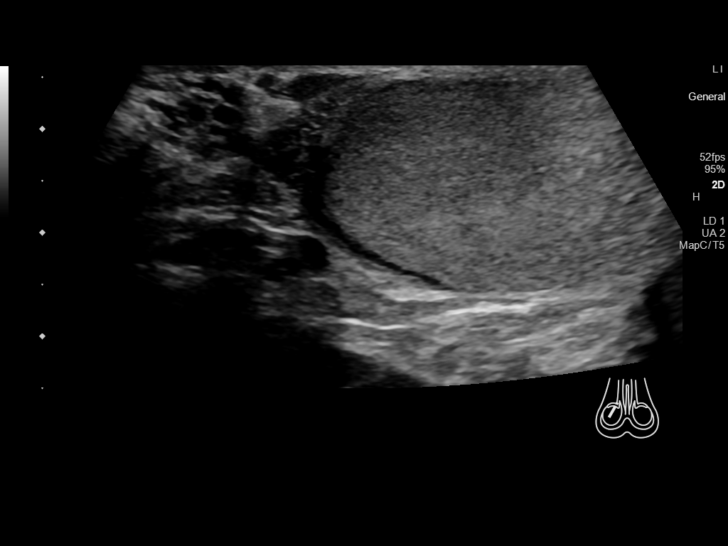
[im 19/50]
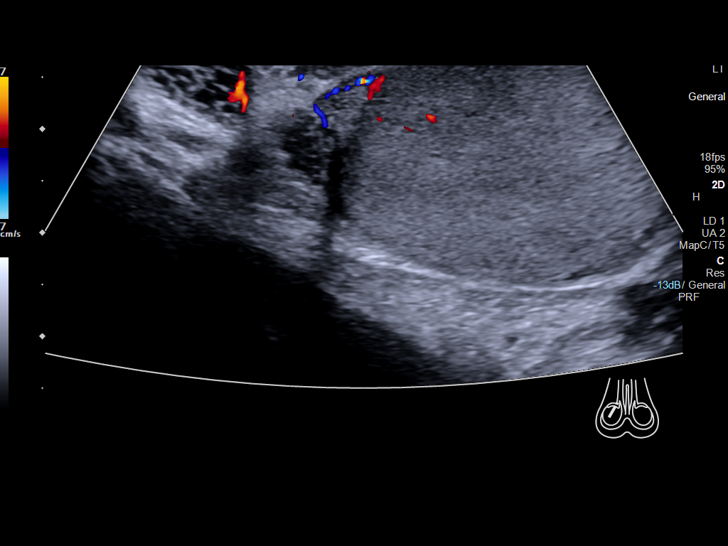
[im 23/50]
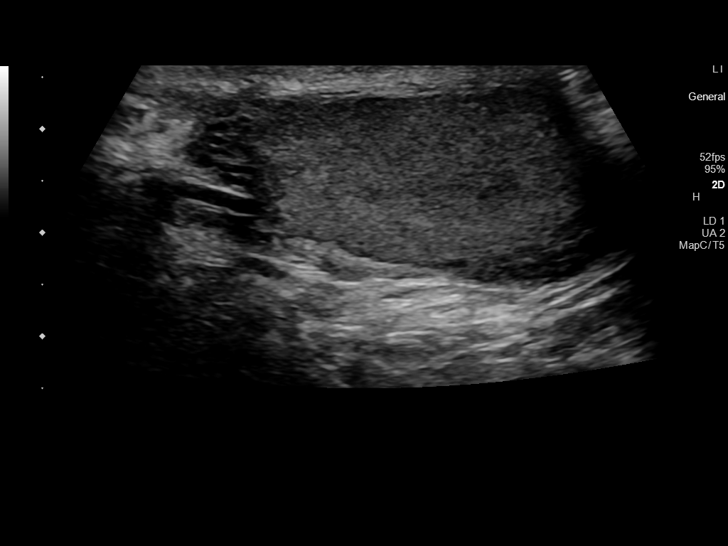
[im 27/50]
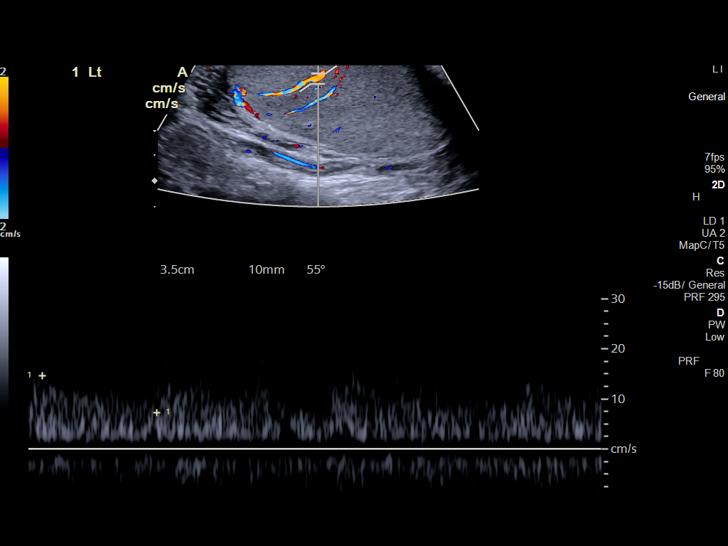
[im 31/50]
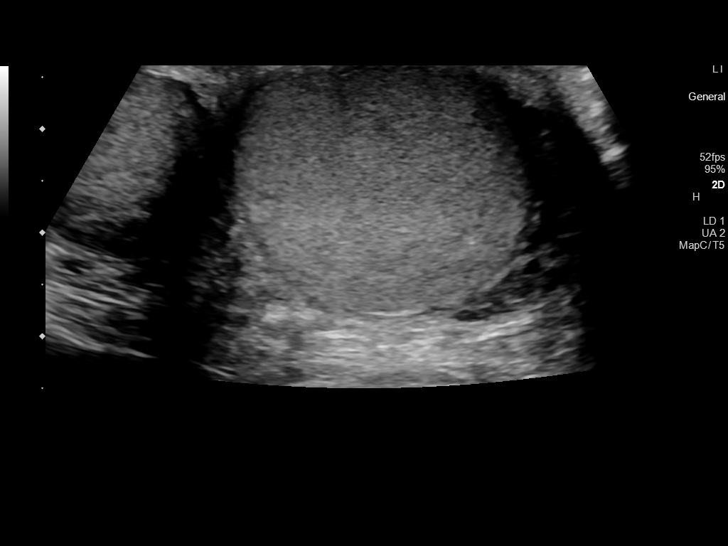
[im 33/50]
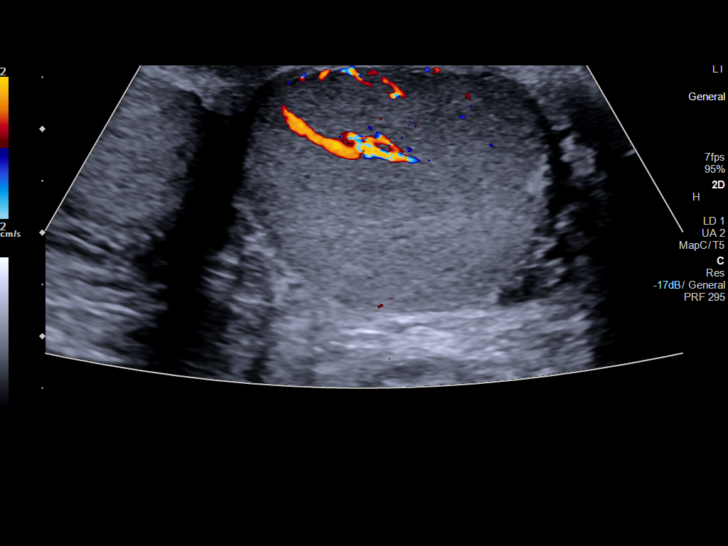
[im 37/50]
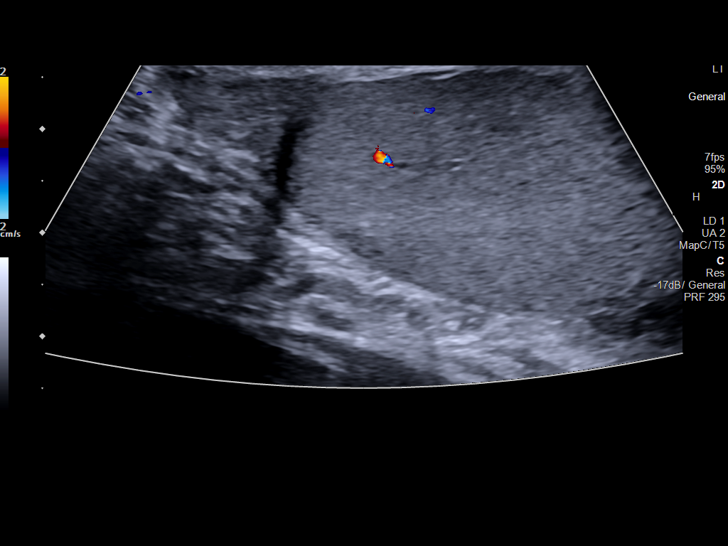
[im 41/50]
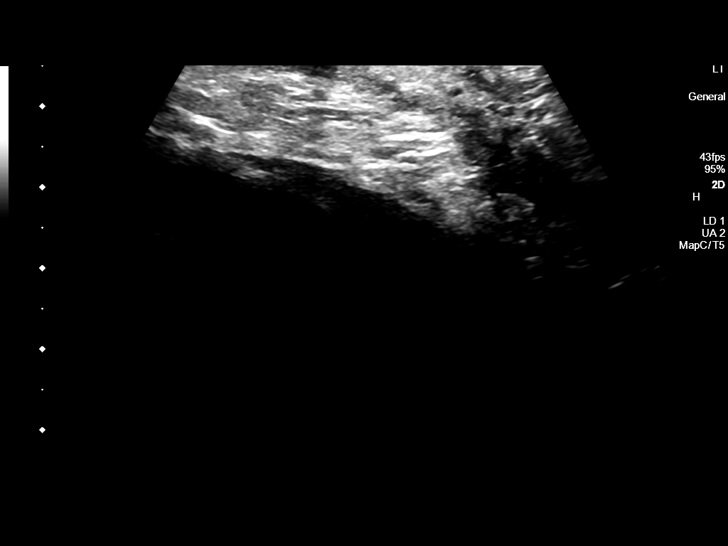
[im 45/50]
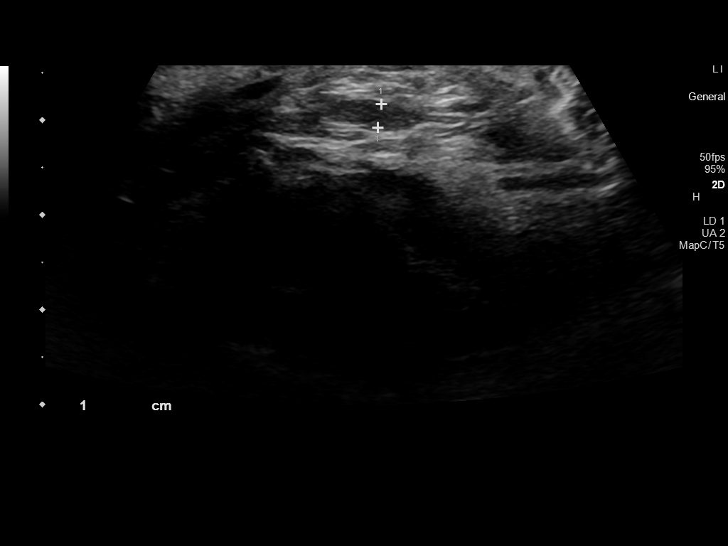
[im 50/50]
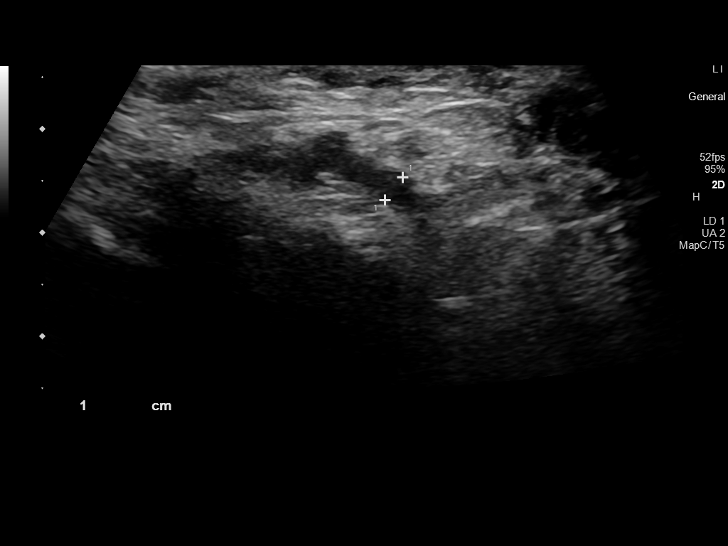

[14 of 25 positions shown; findings below may reference images not displayed]

FINDINGS: Right testicle

Measurements: 4.4 x 2.1 x 3.3 cm. No mass or microlithiasis
visualized.

Left testicle

Measurements: 4.6 x 2.0 x 2.9 cm. No mass or microlithiasis
visualized.

Right epididymis:  Normal in size and appearance.

Left epididymis:  Normal in size and appearance.

Hydrocele:  None visualized.

Varicocele:  None visualized.

Pulsed Doppler interrogation of both testes demonstrates normal low
resistance arterial and venous waveforms bilaterally.
IMPRESSION: 1. Normal exam.
2. No signs of testicular torsion or mass.

## 2021-11-03 ENCOUNTER — Other Ambulatory Visit: Payer: Self-pay

## 2021-11-03 ENCOUNTER — Encounter (HOSPITAL_COMMUNITY): Payer: Self-pay

## 2021-11-03 ENCOUNTER — Ambulatory Visit (HOSPITAL_COMMUNITY)
Admission: EM | Admit: 2021-11-03 | Discharge: 2021-11-03 | Disposition: A | Discharge status: Home / Self Care | Payer: Self-pay

## 2021-11-03 ENCOUNTER — Emergency Department (HOSPITAL_COMMUNITY)
Admission: EM | Admit: 2021-11-03 | Discharge: 2021-11-04 | Disposition: A | Discharge status: Home / Self Care | Payer: Self-pay | Attending: Emergency Medicine | Admitting: Emergency Medicine

## 2021-11-03 DIAGNOSIS — F329 Major depressive disorder, single episode, unspecified: Secondary | ICD-10-CM | POA: Insufficient documentation

## 2021-11-03 DIAGNOSIS — Z046 Encounter for general psychiatric examination, requested by authority: Secondary | ICD-10-CM

## 2021-11-03 DIAGNOSIS — R45851 Suicidal ideations: Secondary | ICD-10-CM | POA: Insufficient documentation

## 2021-11-03 DIAGNOSIS — F4321 Adjustment disorder with depressed mood: Secondary | ICD-10-CM | POA: Insufficient documentation

## 2021-11-03 DIAGNOSIS — T1491XA Suicide attempt, initial encounter: Secondary | ICD-10-CM

## 2021-11-03 DIAGNOSIS — Y900 Blood alcohol level of less than 20 mg/100 ml: Secondary | ICD-10-CM | POA: Insufficient documentation

## 2021-11-03 LAB — COMPREHENSIVE METABOLIC PANEL
ALT: 14 U/L (ref 0–44)
AST: 17 U/L (ref 15–41)
Albumin: 4.2 g/dL (ref 3.5–5.0)
Alkaline Phosphatase: 53 U/L (ref 38–126)
Anion gap: 9 (ref 5–15)
BUN: 11 mg/dL (ref 6–20)
CO2: 26 mmol/L (ref 22–32)
Calcium: 9.5 mg/dL (ref 8.9–10.3)
Chloride: 104 mmol/L (ref 98–111)
Creatinine, Ser: 1.07 mg/dL (ref 0.61–1.24)
GFR, Estimated: >60 mL/min (ref >60)
Glucose, Bld: 91 mg/dL (ref 70–99)
Potassium: 3.7 mmol/L (ref 3.5–5.1)
Sodium: 139 mmol/L (ref 135–145)
Total Bilirubin: 1.9 mg/dL — ABNORMAL HIGH (ref 0.3–1.2)
Total Protein: 7.4 g/dL (ref 6.5–8.1)

## 2021-11-03 LAB — CBC
HCT: 44.4 % (ref 39.0–52.0)
Hemoglobin: 14.7 g/dL (ref 13.0–17.0)
MCH: 28.5 pg (ref 26.0–34.0)
MCHC: 33.1 g/dL (ref 30.0–36.0)
MCV: 86 fL (ref 80.0–100.0)
Platelets: 215 10*3/uL (ref 150–400)
RBC: 5.16 MIL/uL (ref 4.22–5.81)
RDW: 12.2 % (ref 11.5–15.5)
WBC: 7.4 10*3/uL (ref 4.0–10.5)
nRBC: 0 % (ref 0.0–0.2)

## 2021-11-03 LAB — ACETAMINOPHEN LEVEL: Acetaminophen (Tylenol), Serum: <10 ug/mL — ABNORMAL LOW (ref 10–30)

## 2021-11-03 LAB — SALICYLATE LEVEL: Salicylate Lvl: <7 mg/dL — ABNORMAL LOW (ref 7.0–30.0)

## 2021-11-03 LAB — ETHANOL: Alcohol, Ethyl (B): <10 mg/dL (ref <10)

## 2021-11-03 NOTE — ED Notes (Signed)
TTS in progress at this time.  

## 2021-11-03 NOTE — ED Triage Notes (Signed)
Brought in by PD for walkng on the railroad tracks reporting he missed the train.  Patient admitted to PD he wanted to jump in front of the train and wanted to die.  Patient was taken to [facility] voluntarily but patient then became involuntary bc refused to be seen and give up property.

## 2021-11-03 NOTE — ED Provider Notes (Signed)
[facility] EMERGENCY DEPARTMENT Provider Note   CSN: [id number] Arrival date & time: 11/03/21  0844     History  Chief Complaint  Patient presents with   Psychiatric Evaluation    MONTI JILEK is a 26 y.o. male with a self-reported past medical history of depression presenting today in the custody of law enforcement due to potential suicidal ideation.  He is not participatory in history taking however per [employer] patient was found walking on the railroad tracks.  He voiced intention to end his life.  He was taken to behavioral health urgent care however he would not cooperate with giving away his personal belongings so he was arrested and brought here.  Denies any HI, AVH, alcohol or drug use.  When I asked him if he was planning on ending his life but has been thinking about it he said "now I am."  When asked what his plan was on the railroad tracks he says "I do not know."  HPI     Home Medications Prior to Admission medications   Medication Sig Start Date End Date Taking? Authorizing Provider  tiZANidine (ZANAFLEX) 2 MG tablet Take 1 tablet (2 mg total) by mouth every 6 (six) hours as needed for muscle spasms. 11/22/20   Rhys Martini, PA-C      Allergies    Banana    Review of Systems   Review of Systems  Physical Exam Updated Vital Signs BP 130/82 (BP Location: Right Arm)   Pulse 80   Temp 98.3 F (36.8 C) (Oral)   Resp 16   Ht 6' (1.829 m)   Wt 77.1 kg   SpO2 100%   BMI 23.06 kg/m  Physical Exam Vitals and nursing note reviewed.  Constitutional:      Appearance: Normal appearance.  HENT:     Head: Normocephalic and atraumatic.  Eyes:     General: No scleral icterus.    Conjunctiva/sclera: Conjunctivae normal.  Pulmonary:     Effort: Pulmonary effort is normal. No respiratory distress.  Skin:    Findings: No rash.  Neurological:     Mental Status: He is alert.  Psychiatric:     Comments: Very flat affect.  Slow to respond.   Does not always respond.     ED Results / Procedures / Treatments   Labs (all labs ordered are listed, but only abnormal results are displayed) Labs Reviewed  COMPREHENSIVE METABOLIC PANEL - Abnormal; Notable for the following components:      Result Value   Total Bilirubin 1.9 (*)    All other components within normal limits  SALICYLATE LEVEL - Abnormal; Notable for the following components:   Salicylate Lvl <7.0 (*)    All other components within normal limits  ACETAMINOPHEN LEVEL - Abnormal; Notable for the following components:   Acetaminophen (Tylenol), Serum <10 (*)    All other components within normal limits  ETHANOL  CBC  RAPID URINE DRUG SCREEN, HOSP PERFORMED    EKG None  Radiology No results found.  Procedures Procedures   Medications Ordered in ED Medications - No data to display  ED Course/ Medical Decision Making/ A&P                           Medical Decision Making Amount and/or Complexity of Data Reviewed Labs: ordered.   26 year old male presenting today with SI.  Is not under arrest but was brought from [facility] in handcuffs.  Not very participatory but denies any HI/AVH.  Does not deny SI and says that he "maybe" was thinking about ending his life on the railroad tracks.  He is under involuntary commitment.  Medically cleared and ready for TTS evaluation.  Final Clinical Impression(s) / ED Diagnoses Final diagnoses:  Suicidal behavior with attempted self-injury [facility])    Rx / DC Orders Medically cleared, TTS to dispo   Saddie Benders, PA-C 11/03/21 1727    Alvira Monday, MD 11/03/21 2351

## 2021-11-03 NOTE — [facility]HH Counselor (Signed)
@  1545 Patient is still in the waiting room.  Patient to be seen once he is roomed.

## 2021-11-03 NOTE — BH Assessment (Addendum)
Comprehensive Clinical Assessment (CCA) Screening, Triage and Referral Note  11/03/2021 Terrence Waters [id number]  Disposition: Screening/Triage was not completed. Also, unable to determine if Routine, Urgent, and/or Emergent.   Chief Complaint:  Terrence Waters is a 26 year old male that presented to the [facility] at the [facility] escorted by [employer]. He is voluntarily. Refused to allow the [facility] security relinquish his personal belongings. Security stated that they can't force him to give up his personal items. Therefore, [employer] escorted him out of the building in handcuffs.   Visit Diagnosis: Undetermined.   Patient Reported Information How did you hear about [place]? Legal System ([employer])  What Is the Reason for Your Visit/Call Today? Terrence Waters presented to the [facility] with [employer]. Patient was reportedly voluntary. However, refused to allow the [facility] security relinquish his personal belongings. Security stated that they can't force him to give up his items. Therefore, [employer] escorted him out of the building in handcuffs. Clinician was unable to complete his TTS assessment.  How Long Has This Been Causing You Problems? -- (unknown)  What Do You Feel Would Help You the Most Today? No data recorded  Have You Recently Had Any Thoughts About Hurting Yourself? -- (unknown)  Are You Planning to Commit Suicide/Harm Yourself At This time? -- (unknown)   Have you Recently Had Thoughts About Hurting Someone Else? -- (unknown)  Are You Planning to Harm Someone at This Time? -- (unknown)  Explanation: No data recorded  Have You Used Any Alcohol or Drugs in the Past 24 Hours? -- (unknown)  How Long Ago Did You Use Drugs or Alcohol? No data recorded What Did You Use and How Much? No data recorded  Do You Currently Have a Therapist/Psychiatrist? No data recorded Name of Therapist/Psychiatrist: No data recorded  Have You Been Recently Discharged From Any Office Practice or Programs?  No data recorded Explanation of Discharge From Practice/Program: No data recorded   CCA Screening Triage Referral Assessment Type of Contact: No data recorded Telemedicine Service Delivery:   Is this Initial or Reassessment? No data recorded Date Telepsych consult ordered in [facility]:  No data recorded Time Telepsych consult ordered in [facility]:  No data recorded Location of Assessment: No data recorded Provider Location: No data recorded  Collateral Involvement: No data recorded  Does Patient Have a Court Appointed Legal Guardian? No data recorded Name and Contact of Legal Guardian: No data recorded If Minor and Not Living with Parent(s), Who has Custody? No data recorded Is CPS involved or ever been involved? No data recorded Is APS involved or ever been involved? No data recorded  Patient Determined To Be At Risk for Harm To Self or Others Based on Review of Patient Reported Information or Presenting Complaint? No data recorded Method: No data recorded Availability of Means: No data recorded Intent: No data recorded Notification Required: No data recorded Additional Information for Danger to Others Potential: No data recorded Additional Comments for Danger to Others Potential: No data recorded Are There Guns or Other Weapons in Your Home? No data recorded Types of Guns/Weapons: No data recorded Are These Weapons Safely Secured?                            No data recorded Who Could Verify You Are Able To Have These Secured: No data recorded Do You Have any Outstanding Charges, Pending Court Dates, Parole/Probation? No data recorded Contacted To Inform of Risk  of Harm To Self or Others: No data recorded  Does Patient Present under Involuntary Commitment? No data recorded IVC Papers Initial File Date: No data recorded  [place] of Residence: No data recorded  Patient Currently Receiving the Following Services: No data recorded  Determination of Need: No data recorded  Options For  Referral: Other: Comment (unknown)   Discharge Disposition:     Melynda Ripple, Counselor

## 2021-11-03 NOTE — ED Notes (Signed)
Sitter at bedside.

## 2021-11-03 NOTE — ED Notes (Signed)
TTS finished 

## 2021-11-03 NOTE — ED Notes (Signed)
Mother Terrence Waters [phone] would like an update asap

## 2021-11-04 DIAGNOSIS — F4321 Adjustment disorder with depressed mood: Secondary | ICD-10-CM

## 2021-11-04 DIAGNOSIS — Z046 Encounter for general psychiatric examination, requested by authority: Secondary | ICD-10-CM

## 2021-11-04 NOTE — ED Notes (Signed)
Terrence Waters, notified that he will be discharged today and he will need to make arrangements for a ride home or a bus pass will be given to him. He originally was trying to find a way to [place], but that was not able to be arranged.

## 2021-11-04 NOTE — ED Notes (Signed)
IVC rescinded 

## 2021-11-04 NOTE — ED Notes (Signed)
Terrence Waters, given his belongings from the locker. After dressing pt states that he had keys and a cell phone. There is no paper work but will go to security to look.

## 2021-11-04 NOTE — BH Assessment (Signed)
Comprehensive Clinical Assessment (CCA) Note  11/04/2021 RAND ETCHISON [id number]  Disposition: Osborne Oman, NP,   The patient demonstrates the following risk factors for suicide: Chronic risk factors for suicide include: psychiatric disorder of depression . Acute risk factors for suicide include: unemployment and loss (financial, interpersonal, professional). Protective factors for this patient include: responsibility to others (children, family), coping skills, and hope for the future. Considering these factors, the overall suicide risk at this point appears to be high. Patient is not appropriate for outpatient follow up.  Flowsheet Row ED from 11/03/2021 in [facility] EMERGENCY DEPARTMENT ED from 11/24/2020 in [facility] at [facility] ED from 11/22/2020 in [facility] at [facility] RISK CATEGORY High Risk Error: Question 6 not populated No Risk      Terrence Waters is a 26 year old male presenting under [facility]C to [facility] due to SI per law enforcement. Patient denied SI, HI, psychosis and alcohol/drug usage. Per triage note, brought in by PD for walkng on the railroad tracks reporting he missed the train.  Patient admitted to PD he wanted to jump in front of the train and wanted to die.  Patient was taken to [facility] voluntarily but patient then became involuntary bc refused to be seen and give up property. During assessment, patient reported he was taking a walk and reflecting and that he did not want to harm himself. Patient reported main stressor includes not having a job and not knowing where his next meal is coming from. Patient stated, "my goal is to be the best I can be for my 12 year old son". Patient is currently unemployed and reports having an interview earlier today that he missed because he was brought to ED by police, which has increased his frustration. Patient reported worsening depressive symptoms. Patient denied prior psych  hospitalizations, suicide attempts and self-harming behaviors. Patient reported "poor sleep lately" and poor appetite due to not being able to afford food.   Patient denied receiving outpatient mental health services. Patient denied being prescribed any psych medications.   Patient recently moved out of apartment and is currently living with a friend. Patient denied access to guns. Patient was pleasant and cooperative. Patient contracted for safety. No collateral contacted given.   Chief Complaint:  Chief Complaint  Patient presents with   Psychiatric Evaluation   Visit Diagnosis:  Major depressive disorder   CCA Screening, Triage and Referral (STR)  Patient Reported Information How did you hear about [place]? Other (Comment) (IVC)  What Is the Reason for Your Visit/Call Today? IVC due to SI  How Long Has This Been Causing You Problems? <Week  What Do You Feel Would Help You the Most Today? Treatment for Depression or other mood problem   Have You Recently Had Any Thoughts About Hurting Yourself? No  Are You Planning to Commit Suicide/Harm Yourself At This time? No   Have you Recently Had Thoughts About Hurting Someone Karolee Ohs? No  Are You Planning to Harm Someone at This Time? No  Explanation: No data recorded  Have You Used Any Alcohol or Drugs in the Past 24 Hours? No  How Long Ago Did You Use Drugs or Alcohol? No data recorded What Did You Use and How Much? No data recorded  Do You Currently Have a Therapist/Psychiatrist? No  Name of Therapist/Psychiatrist: No data recorded  Have You Been Recently Discharged From Any Office Practice or Programs? No  Explanation of Discharge From Practice/Program: No data  recorded    CCA Screening Triage Referral Assessment Type of Contact: Tele-Assessment  Telemedicine Service Delivery:   Is this Initial or Reassessment? Initial Assessment  Date Telepsych consult ordered in [facility]:  11/03/21  Time Telepsych consult ordered in [facility]:   1000  Location of Assessment: [facility] ED  Provider Location: [facility] Assessment Services   Collateral Involvement: none reported   Does Patient Have a Automotive engineer Guardian? No data recorded Name and Contact of Legal Guardian: No data recorded If Minor and Not Living with Parent(s), Who has Custody? No data recorded Is CPS involved or ever been involved? No data recorded Is APS involved or ever been involved? No data recorded  Patient Determined To Be At Risk for Harm To Self or Others Based on Review of Patient Reported Information or Presenting Complaint? No data recorded Method: No data recorded Availability of Means: No data recorded Intent: No data recorded Notification Required: No data recorded Additional Information for Danger to Others Potential: No data recorded Additional Comments for Danger to Others Potential: No data recorded Are There Guns or Other Weapons in Your Home? No data recorded Types of Guns/Weapons: No data recorded Are These Weapons Safely Secured?                            No data recorded Who Could Verify You Are Able To Have These Secured: No data recorded Do You Have any Outstanding Charges, Pending Court Dates, Parole/Probation? No data recorded Contacted To Inform of Risk of Harm To Self or Others: No data recorded   Does Patient Present under Involuntary Commitment? Yes  IVC Papers Initial File Date: 11/03/21   [place] of Residence: [place]   Patient Currently Receiving the Following Services: Not Receiving Services   Determination of Need: Urgent (48 hours)   Options For Referral: Medication Management; Outpatient Therapy; Inpatient Hospitalization (observation)     CCA Biopsychosocial Patient Reported Schizophrenia/Schizoaffective Diagnosis in Past: No data recorded  Strengths: self-awareness   Mental Health Symptoms Depression:   Worthlessness; Hopelessness; Fatigue; Change in energy/activity; Increase/decrease in  appetite   Duration of Depressive symptoms:  Duration of Depressive Symptoms: Greater than two weeks   Mania:   None   Anxiety:    Worrying; Tension; Sleep   Psychosis:   None   Duration of Psychotic symptoms:    Trauma:   None   Obsessions:   None   Compulsions:   None   Inattention:   None   Hyperactivity/Impulsivity:   None   Oppositional/Defiant Behaviors:   None   Emotional Irregularity:   None   Other Mood/Personality Symptoms:  No data recorded   Mental Status Exam Appearance and self-care  Stature:   Average   Weight:   Average weight   Clothing:   Age-appropriate   Grooming:   Normal   Cosmetic use:   None   Posture/gait:   Normal   Motor activity:   Not Remarkable   Sensorium  Attention:   Normal   Concentration:   Normal   Orientation:   X5   Recall/memory:   Normal   Affect and Mood  Affect:   Appropriate; Depressed   Mood:   Depressed   Relating  Eye contact:   Normal   Facial expression:   Depressed   Attitude toward examiner:   Cooperative   Thought and Language  Speech flow:  Clear and Coherent   Thought content:  Appropriate to Mood and Circumstances   Preoccupation:   None   Hallucinations:   None   Organization:  No data recorded  [employer] of Knowledge:   Average   Intelligence:   Average   Abstraction:   Normal   Judgement:   Poor   Reality Testing:   Distorted   Insight:   Lacking   Decision Making:   Impulsive   Social Functioning  Social Maturity:   Impulsive   Social Judgement:   Normal   Stress  Stressors:   Surveyor, quantity; Housing; Transitions   Coping Ability:   Overwhelmed   Skill Deficits:   Scientist, physiological; Self-control   Supports:   Support needed     Religion: Religion/Spirituality Are You A Religious Person?:  Industrial/product designer)  Leisure/Recreation: Leisure / Recreation Do You Have Hobbies?: Yes Leisure and Hobbies: musician,  plays multiple instruments  Exercise/Diet: Exercise/Diet Do You Exercise?:  (uta) Do You Follow a Special Diet?:  (uta) Do You Have Any Trouble Sleeping?: Yes Explanation of Sleeping Difficulties: poor   CCA Employment/Education Employment/Work Situation: Employment / Work Situation Employment Situation: Unemployed Has Patient ever Been in Equities trader?: No  Education: Education Is Patient Currently Attending School?: No Last Grade Completed: 12 Did You Product manager?: No Did You Have An Individualized Education Program (IIEP):  Rich Reining) Did You Have Any Difficulty At [employer]?:  Rich Reining) Patient's Education Has Been Impacted by Current Illness:  (uta)   CCA Family/Childhood History Family and Relationship History: Family history Marital status: Single Does patient have children?: Yes How many children?: 1 How is patient's relationship with their children?: good  Childhood History:  Childhood History By whom was/is the patient raised?:  (uta) Did patient suffer any verbal/emotional/physical/sexual abuse as a child?: No Did patient suffer from severe childhood neglect?: No Has patient ever been sexually abused/assaulted/raped as an adolescent or adult?: No Was the patient ever a victim of a crime or a disaster?:  (uta) Witnessed domestic violence?:  (uta) Has patient been affected by domestic violence as an adult?:  Industrial/product designer)  Child/Adolescent Assessment:     CCA Substance Use Alcohol/Drug Use: Alcohol / Drug Use Pain Medications: See MAR Prescriptions: See MAR Over the Counter: See MAR History of alcohol / drug use?: No history of alcohol / drug abuse                         ASAM's:  Six Dimensions of Multidimensional Assessment  Dimension 1:  Acute Intoxication and/or Withdrawal Potential:      Dimension 2:  Biomedical Conditions and Complications:      Dimension 3:  Emotional, Behavioral, or Cognitive Conditions and Complications:     Dimension 4:   Readiness to Change:     Dimension 5:  Relapse, Continued use, or Continued Problem Potential:     Dimension 6:  Recovery/Living Environment:     ASAM Severity Score:    ASAM Recommended Level of Treatment:     Substance use Disorder (SUD)    Recommendations for Services/Supports/Treatments: Recommendations for Services/Supports/Treatments Recommendations For Services/Supports/Treatments: Inpatient Hospitalization, Medication Management, Individual Therapy, Other (Comment) (Observation)  Discharge Disposition:    DSM5 Diagnoses: Patient Active Problem List   Diagnosis Date Noted   Suicidal ideation      Referrals to Alternative Service(s): Referred to Alternative Service(s):   Place:   Date:   Time:    Referred to Alternative Service(s):   Place:   Date:   Time:  Referred to Alternative Service(s):   Place:   Date:   Time:    Referred to Alternative Service(s):   Place:   Date:   Time:     Burnetta Sabin, [facility]

## 2021-11-04 NOTE — ED Provider Notes (Signed)
IVC rescinded.  Outpatient follow-up set up for patient.  Okay for discharge   Terald Sleeper, MD 11/04/21 [phone]

## 2021-11-04 NOTE — ED Notes (Signed)
Grandmother visiting

## 2021-11-04 NOTE — Discharge Instructions (Signed)
You are scheduled for an assessment for the Partial Hospitalization Program (PHP) on Thursday, November 11, 2021 at 1:00 pm. This appointment will last approximately one hour and will be virtual via Webex. PHP is virtual group therapy that runs Monday - Friday from 9:00 am - 1:00 pm. Please download the [employer] app prior to the appointment. If you need to cancel or reschedule, please call [phone].

## 2021-11-04 NOTE — ED Notes (Signed)
Belongings located in security. Signed for his key and cell phone.

## 2021-11-04 NOTE — ED Notes (Signed)
Social Worker wanted confirmation that Terrence Waters will attend group meeting for 2 weeks M - F 9 - 1. He states that it is something that he can and will do along with having Internet connection.

## 2021-11-04 NOTE — BH Assessment (Signed)
[facility] Assessment Progress Note   Per Ophelia Shoulder, NP, this pt does not require psychiatric hospitalization at this time.  Pt is psychiatrically cleared.  Pt would benefit from the Partial Hospitalization Program offered through [facility], which is currently being offered virtually due to Covid-19.  Pt is reportedly interested in the program and would have the resources necessary to participate.  I have contacted Milana Na, [facility], who has scheduled pt for intake on Thursday, 11/11/2021  at 13:00 This has been included in pt's discharge instructions.  EDP Alvester Chou, MD and pt's nurse, Katrina, have been notified.  Doylene Canning, MA Triage Specialist [phone]

## 2021-11-04 NOTE — Consult Note (Signed)
Telepsych Consultation   Reason for Consult:  Psychiatric Reassessment Referring Physician:  Dr. Susette Racer Location of Patient:    [facility] Location of Provider: Other: virtual home office  Patient Identification: Terrence Waters MRN:  [id number] Principal Diagnosis: Adjustment disorder with depressed mood Diagnosis:  Principal Problem:   Adjustment disorder with depressed mood Active Problems:   Involuntary commitment   Total Time spent with patient: 30 minutes  Subjective:   Terrence Waters is a 26 y.o. male patient admitted with suicidal ideations, brought in by police after found walking on railroad tracks.   HPI:   Patient seen via telepsych by this provider; chart reviewed and consulted with Dr. Lucianne Muss on 11/04/21.  On evaluation Terrence Waters reports sleep was "hard" due to environmental concerns with being in the hospital.  States his appetite is good but he does not like hospital food. Patient makes light jokes about the quality of hospital food and occasionally smiles while doing so.   Regarding events that led to current IVC and subsequent hospitalization, he admits to walking near the railroad tracks but denies suicidal intent, states he has a two year old son that he needs to be here for.  He collaborates most of what has already been captured in the [facility] admission assessment. Has many psychosocial triggers but is now more optimistic since having the night to think about his situation.  He would like to start medications but states he does not have insurance; most of his family lives in [place] but he does not want to move there. His father and grandmother are supportive of him; his father in not in the position to help him.    Pt states he may have 1 to 2 drinks in a month, denies excessive usage He drinks alcohol but states it's not that often; he used to smoke marijuana but stopped a few months ago--"I just needed to do something different  and that wasn't helping."  Patient states he has a job interview with [employer] yesterday but he missed it d/t being in the hospital.  Today he relates his plan to continue to seek employment upon discharge.     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   Family History:  Family History  Problem Relation Age of Onset   Healthy Mother    Healthy Father    Family Psychiatric  History: unknown Social History:  Social History   Substance and Sexual Activity  Alcohol Use Yes     Social History   Substance and Sexual Activity  Drug Use Yes   Types: Marijuana    Social History   Socioeconomic History   Marital status: Single    Spouse name: Not on file   Number of children: Not on file   Years of education: Not on file   Highest education level: Not on file  Occupational History   Not on file  Tobacco Use   Smoking status: Never   Smokeless tobacco: Never  Vaping Use   Vaping Use: Never used  Substance and Sexual Activity   Alcohol use: Yes   Drug use: Yes  Types: Marijuana   Sexual activity: Yes    Birth control/protection: Condom  Other Topics Concern   Not on file  Social History Narrative   Not on file   Social Determinants of Health   Financial Resource Strain: Not on file  Food Insecurity: Not on file  Transportation Needs: Not on file  Physical Activity: Not on file  Stress: Not on file  Social Connections: Not on file   Additional Social History:    Allergies:   Allergies  Allergen Reactions   Banana Nausea And Vomiting    Labs:  Results for orders placed or performed during the hospital encounter of 11/03/21 (from the past 48 hour(s))  Comprehensive  metabolic panel     Status: Abnormal   Collection Time: 11/03/21  8:57 AM  Result Value Ref Range   Sodium 139 135 - 145 mmol/L   Potassium 3.7 3.5 - 5.1 mmol/L   Chloride 104 98 - 111 mmol/L   CO2 26 22 - 32 mmol/L   Glucose, Bld 91 70 - 99 mg/dL    Comment: Glucose reference range applies only to samples taken after fasting for at least 8 hours.   BUN 11 6 - 20 mg/dL   Creatinine, Ser 2.68 0.61 - 1.24 mg/dL   Calcium 9.5 8.9 - 34.1 mg/dL   Total Protein 7.4 6.5 - 8.1 g/dL   Albumin 4.2 3.5 - 5.0 g/dL   AST 17 15 - 41 U/L   ALT 14 0 - 44 U/L   Alkaline Phosphatase 53 38 - 126 U/L   Total Bilirubin 1.9 (H) 0.3 - 1.2 mg/dL   GFR, Estimated >96 >22 mL/min    Comment: (NOTE) Calculated using the CKD-EPI Creatinine Equation (2021)    Anion gap 9 5 - 15    Comment: Performed at [facility], [address] [place], [place] [address]  Ethanol     Status: None   Collection Time: 11/03/21  8:57 AM  Result Value Ref Range   Alcohol, Ethyl (B) <10 <10 mg/dL    Comment: (NOTE) Lowest detectable limit for serum alcohol is 10 mg/dL.  For medical purposes only. Performed at [facility], [address], [place], [place] [address]   Salicylate level     Status: Abnormal   Collection Time: 11/03/21  8:57 AM  Result Value Ref Range   Salicylate Lvl <7.0 (L) 7.0 - 30.0 mg/dL    Comment: Performed at [facility], [address], [place], [place] [address]  Acetaminophen level     Status: Abnormal   Collection Time: 11/03/21  8:57 AM  Result Value Ref Range   Acetaminophen (Tylenol), Serum <10 (L) 10 - 30 ug/mL    Comment: (NOTE) Therapeutic concentrations vary significantly. A range of 10-30 ug/mL  may be an effective concentration for many patients. However, some  are best treated at concentrations outside of this range. Acetaminophen concentrations >150 ug/mL at 4 hours after ingestion  and >50 ug/mL at 12 hours after ingestion are often associated with  toxic  reactions.  Performed at [facility], [address] [place], [place] [address]   cbc     Status: None   Collection Time: 11/03/21  8:57 AM  Result Value Ref Range   WBC 7.4 4.0 - 10.5 K/uL   RBC 5.16 4.22 - 5.81 MIL/uL   Hemoglobin 14.7 13.0 - 17.0 g/dL   HCT 18.5 63.1 - 49.7 %   MCV 86.0  80.0 - 100.0 fL   MCH 28.5 26.0 - 34.0 pg   MCHC 33.1 30.0 - 36.0 g/dL   RDW 12.4 58.0 - 99.8 %   Platelets 215 150 - 400 K/uL   nRBC 0.0 0.0 - 0.2 %    Comment: Performed at [facility], [address], [place], [place] [address]    Medications:  No current facility-administered medications for this encounter.   Current Outpatient Medications  Medication Sig Dispense Refill   tiZANidine (ZANAFLEX) 2 MG tablet Take 1 tablet (2 mg total) by mouth every 6 (six) hours as needed for muscle spasms. 21 tablet 0    Musculoskeletal: pt moves all extremities and ambulates independently.  Strength & Muscle Tone: within normal limits Gait & Station: normal Patient leans: N/A   Psychiatric Specialty Exam:  Presentation  General Appearance: Appropriate for Environment; Casual  Eye Contact:Good  Speech:Clear and Coherent; Normal Rate  Speech Volume:Decreased  Handedness:Right   Mood and Affect  Mood:Depressed  Affect:Congruent; Appropriate   Thought Process  Thought Processes:Coherent; Goal Directed  Descriptions of Associations:Intact  Orientation:No data recorded, pt alert and oriented x4 Thought Content:Logical  History of Schizophrenia/Schizoaffective disorder:No data recorded Duration of Psychotic Symptoms:No data recorded Hallucinations:Hallucinations: None  Ideas of Reference:None  Suicidal Thoughts:Suicidal Thoughts: No (present on admissions but has since cleared up)  Homicidal Thoughts:Homicidal Thoughts: No   Sensorium  Memory:Immediate Good; Recent Good; Remote Good  Judgment:Good  Insight:Good   Executive Functions   Concentration:Good  Attention Span:Good  Recall:Good  Fund of Knowledge:Good  Language:Good   Psychomotor Activity  Psychomotor Activity:Psychomotor Activity: Normal   Assets  Assets:Communication Skills; Desire for Improvement; Social Support (pt grandmother visited him during his stay at the ED)   Sleep  Sleep:Sleep: Fair Number of Hours of Sleep: 6    Physical Exam: Physical Exam Constitutional:      Appearance: Normal appearance.  Cardiovascular:     Rate and Rhythm: Normal rate.     Pulses: Normal pulses.  Pulmonary:     Effort: Pulmonary effort is normal.  Musculoskeletal:        General: Normal range of motion.     Cervical back: Normal range of motion.  Neurological:     General: No focal deficit present.     Mental Status: He is alert and oriented to person, place, and time.  Psychiatric:        Attention and Perception: Attention and perception normal.        Mood and Affect: Mood is depressed.        Speech: Speech normal.        Behavior: Behavior normal. Behavior is cooperative.        Thought Content: Thought content normal. Thought content is not paranoid or delusional. Thought content does not include homicidal or suicidal ideation. Thought content does not include homicidal or suicidal plan.        Cognition and Memory: Cognition and memory normal.        Judgment: Judgment normal.    Review of Systems  Constitutional: Negative.   HENT: Negative.    Eyes: Negative.   Respiratory: Negative.    Cardiovascular: Negative.   Gastrointestinal: Negative.   Genitourinary: Negative.   Musculoskeletal: Negative.   Skin: Negative.   Neurological: Negative.   Endo/Heme/Allergies: Negative.   Psychiatric/Behavioral:  Positive for depression. Negative for hallucinations, substance abuse and suicidal ideas. The patient is not nervous/anxious.    Blood pressure 111/64, pulse 67, temperature 98.2 F (  36.8 C), temperature source Oral, resp. rate 18,  height 6' (1.829 m), weight 77.1 kg, SpO2 99 %. Body mass index is 23.06 kg/m.  Treatment Plan Summary: Patient is not currently interested in inpatient services but expresses agreement to continue outpatient treatment., He no longer endorses suicidal ideations no plan or intent to end his life and contracts for safety.  Pt does not have a history of self harming behaviors, prior suicide attempts or inpatient psychiatric hospitalization. He accepts referral to the outpatient mental health partial hospitalization program, initial visit is set up prior to leaving the hospital. Will defer starting antidepressant to [facility] provider.  Plan- As per above assessment, there are no current grounds for involuntary commitment at this time.  Patient's dealing with psychosocial stressors, adjustment period but is future oriented and has a plan to find employment and work to improve his situation.  He has a 39 year old son whom he smiles when discussing and states he's a strong protective factor against self harm.     Disposition: No evidence of imminent risk to self or others at present.   Patient does not meet criteria for psychiatric inpatient admission. Supportive therapy provided about ongoing stressors. Discussed crisis plan, support from social network, calling 911, coming to the Emergency Department, and calling Suicide Hotline. Pt referred to [facility].   This service was provided via telemedicine using a 2-way, interactive audio and video technology.  Names of all persons participating in this telemedicine service and their role in this encounter. Name: Terrence Waters Role: Patient  Name: Ophelia Shoulder Role: PMHNP    Chales Abrahams, NP 11/04/2021 6:22 PM

## 2021-11-11 ENCOUNTER — Encounter (HOSPITAL_COMMUNITY): Payer: Self-pay

## 2021-11-11 ENCOUNTER — Ambulatory Visit (HOSPITAL_COMMUNITY): Payer: No Payment, Other

## 2021-11-11 ENCOUNTER — Telehealth (HOSPITAL_COMMUNITY): Payer: Self-pay | Admitting: Professional

## 2021-11-12 ENCOUNTER — Telehealth (HOSPITAL_COMMUNITY): Payer: Self-pay | Admitting: Licensed Clinical Social Worker

## 2021-11-12 NOTE — Telephone Encounter (Signed)
Cln updated pt's current phone number, which was provided by Janice Coffin via email.

## 2021-11-17 ENCOUNTER — Ambulatory Visit (HOSPITAL_COMMUNITY): Payer: No Payment, Other | Admitting: Professional

## 2021-11-17 ENCOUNTER — Telehealth (HOSPITAL_COMMUNITY): Payer: Self-pay | Admitting: Professional

## 2021-11-23 ENCOUNTER — Ambulatory Visit (HOSPITAL_COMMUNITY): Payer: No Payment, Other | Admitting: Licensed Clinical Social Worker

## 2021-12-03 ENCOUNTER — Ambulatory Visit (HOSPITAL_COMMUNITY): Payer: Self-pay | Admitting: Psychiatry
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
## 2023-12-14 ENCOUNTER — Emergency Department (HOSPITAL_COMMUNITY)
Admission: EM | Admit: 2023-12-14 | Discharge: 2023-12-15 | Discharge status: Against Medical Advice | Payer: Self-pay | Attending: Emergency Medicine | Admitting: Emergency Medicine

## 2023-12-14 ENCOUNTER — Other Ambulatory Visit: Payer: Self-pay

## 2023-12-14 ENCOUNTER — Encounter (HOSPITAL_COMMUNITY): Payer: Self-pay

## 2023-12-14 DIAGNOSIS — W228XXA Striking against or struck by other objects, initial encounter: Secondary | ICD-10-CM | POA: Insufficient documentation

## 2023-12-14 DIAGNOSIS — Y990 Civilian activity done for income or pay: Secondary | ICD-10-CM | POA: Insufficient documentation

## 2023-12-14 DIAGNOSIS — S01112A Laceration without foreign body of left eyelid and periocular area, initial encounter: Secondary | ICD-10-CM | POA: Insufficient documentation

## 2023-12-14 DIAGNOSIS — Z5321 Procedure and treatment not carried out due to patient leaving prior to being seen by health care provider: Secondary | ICD-10-CM | POA: Insufficient documentation

## 2023-12-14 MED ORDER — ACETAMINOPHEN 325 MG PO TABS
650.0000 mg | ORAL_TABLET | Freq: Four times a day (QID) | ORAL | Status: AC | PRN
  Administered 2023-12-14: 650 mg via ORAL

## 2023-12-14 NOTE — ED Triage Notes (Signed)
 WHILE AT WORK PT HIT HEAD ON RAIL AND HAS SMALL LACE TO LEFT EYEBROW. NO LOC. NO DIZZINESS. DOES HAVE 7/10 HEADACHE. BLEEDING CONTROLLED

## 2023-12-15 NOTE — ED Notes (Signed)
 Patient stated he was going to Urgent Care tomorrow so he left.

## 2024-03-03 ENCOUNTER — Encounter (HOSPITAL_COMMUNITY): Payer: Self-pay

## 2024-03-03 ENCOUNTER — Ambulatory Visit (HOSPITAL_COMMUNITY)
Admission: EM | Admit: 2024-03-03 | Discharge: 2024-03-03 | Disposition: A | Discharge status: Home / Self Care | Payer: Self-pay

## 2024-03-03 DIAGNOSIS — B349 Viral infection, unspecified: Secondary | ICD-10-CM

## 2024-03-03 LAB — POC COVID19/FLU A&B COMBO
Covid Antigen, POC: NEGATIVE
Influenza A Antigen, POC: NEGATIVE
Influenza B Antigen, POC: NEGATIVE

## 2024-03-03 MED ORDER — PREDNISONE 20 MG PO TABS: 40.0000 mg | ORAL_TABLET | Freq: Every day | ORAL | 0 refills | Status: AC

## 2024-03-03 MED ORDER — AZELASTINE HCL 0.1 % NA SOLN: 1.0000 | Freq: Two times a day (BID) | NASAL | 1 refills | Status: AC

## 2024-03-03 MED ORDER — PROMETHAZINE-DM 6.25-15 MG/5ML PO SYRP: 10.0000 mL | ORAL_SOLUTION | Freq: Three times a day (TID) | ORAL | 0 refills | Status: AC | PRN

## 2024-03-03 NOTE — ED Provider Notes (Signed)
 [facility] Agenda  Note:  This document was prepared using Conservation officer, historic buildings and may include unintentional dictation errors.  MRN: [id number] DOB: 06/07/1995  Subjective:   Terrence Waters is a 27 y.o. male presenting for nasal congestion, cough, body aches, intermittent nausea and vomiting since Thursday.  Patient reports that he woke up with nausea Thursday vomited once on Friday and has since had nasal congestion, cough and ongoing bodyaches.  Patient denies any known fever, shortness of breath, chest pain, weakness, dizziness.  Patient denies any vomiting since Friday.  No current nausea.  Patient reports that his son was sick with similar symptoms last week and stated to stay home from school but symptoms have totally resolved.  Patient reports using Pepto-Bismol and other over-the-counter cough and cold medication with minimal improvement.  No current facility-administered medications for this encounter.  Current Outpatient Medications:    azelastine (ASTELIN) 0.1 % nasal spray, Place 1 spray into both nostrils 2 (two) times daily. 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  Objective:    Vitals: BP 137/85 (BP Location: Right Arm)   Pulse 76   Temp 98.1 F (36.7 C) (Oral)   Resp 16   SpO2 97%   Physical Exam Vitals and nursing note reviewed.  Constitutional:      General: He is not in acute distress.    Appearance: Normal appearance. He is well-developed. He is not ill-appearing or toxic-appearing.  HENT:     Head: Normocephalic.     Nose: Congestion and rhinorrhea present.     Mouth/Throat:     Mouth: Mucous membranes are moist.     Pharynx: Oropharynx is clear.  Cardiovascular:     Rate and Rhythm: Normal rate.  Pulmonary:     Effort: Pulmonary effort is normal. No respiratory distress.     Breath sounds: No stridor. No wheezing.  Chest:     Chest wall: No tenderness.  Skin:    General: Skin is warm and dry.  Neurological:     General: No focal deficit present.     Mental Status: He is alert and oriented to person, place, and time.  Psychiatric:        Mood and Affect: Mood normal.        Behavior: Behavior normal.     Procedures  Results for orders placed or performed during the hospital encounter of 03/03/24 (from the past 24  hours)  POC Covid19/Flu A&B Antigen     Status: None   Collection Time: 03/03/24 11:05 AM  Result Value Ref Range   Influenza A Antigen, POC Negative Negative   Influenza B Antigen, POC Negative Negative   Covid Antigen, POC Negative Negative    Assessment and Plan :     Discharge Instructions       1. Acute viral syndrome (Primary) - POC Covid19/Flu A&B Antigen complete in UC is negative for COVID and influenza - azelastine (ASTELIN) 0.1 % nasal spray; Place 1 spray into both nostrils 2 (two) times daily. Use in each nostril as directed  Dispense: 30 mL; Refill: 1 - promethazine -dextromethorphan (PROMETHAZINE -DM) 6.25-15 MG/5ML syrup; Take 10 mLs by mouth 3 (three) times daily as needed for cough.  Dispense: 240 mL; Refill:  0 - predniSONE (DELTASONE) 20 MG tablet; Take 2 tablets (40 mg total) by mouth daily for 5 days.  Dispense: 10 tablet; Refill: 0 -Continue to monitor symptoms for any change in severity if there is any escalation of current symptoms or development of new symptoms follow-up in ER for further evaluation and management.      Terrence Waters   Terrence Waters, [place], [place] 03/03/24 1149

## 2024-03-03 NOTE — ED Triage Notes (Signed)
 Patient here today with c/o nausea since Thursday. Patient states taht Friday he was vomiting. Patient also has some nasal congestion, cough, and body soreness. He has taken Nyquil and Pepto with some relief. His son was sick but he is not sick anymore. His son's grandmother was also sick.

## 2024-03-03 NOTE — Discharge Instructions (Addendum)
  1. Acute viral syndrome (Primary) - POC Covid19/Flu A&B Antigen complete in UC is negative for COVID and influenza - azelastine (ASTELIN) 0.1 % nasal spray; Place 1 spray into both nostrils 2 (two) times daily. Use in each nostril as directed  Dispense: 30 mL; Refill: 1 - promethazine -dextromethorphan (PROMETHAZINE -DM) 6.25-15 MG/5ML syrup; Take 10 mLs by mouth 3 (three) times daily as needed for cough.  Dispense: 240 mL; Refill: 0 - predniSONE (DELTASONE) 20 MG tablet; Take 2 tablets (40 mg total) by mouth daily for 5 days.  Dispense: 10 tablet; Refill: 0 -Continue to monitor symptoms for any change in severity if there is any escalation of current symptoms or development of new symptoms follow-up in ER for further evaluation and management.
